# Patient Record
Sex: Female | Born: 1935
Health system: Southern US, Community
[De-identification: ages and names within clinical notes are randomized; demographics above are authoritative.]

## PROBLEM LIST (undated history)

## (undated) DIAGNOSIS — M1990 Unspecified osteoarthritis, unspecified site: Secondary | ICD-10-CM

## (undated) DIAGNOSIS — M8580 Other specified disorders of bone density and structure, unspecified site: Secondary | ICD-10-CM

## (undated) DIAGNOSIS — E039 Hypothyroidism, unspecified: Secondary | ICD-10-CM

## (undated) HISTORY — PX: TONSILLECTOMY: SUR1361

## (undated) HISTORY — PX: ABDOMINAL HYSTERECTOMY: SHX81

## (undated) HISTORY — PX: REPLACEMENT TOTAL KNEE: SUR1224

## (undated) HISTORY — PX: OTHER SURGICAL HISTORY: SHX169

---

## 1998-08-24 ENCOUNTER — Other Ambulatory Visit: Admission: RE | Admit: 1998-08-24 | Discharge: 1998-08-24 | Payer: Self-pay | Admitting: *Deleted

## 2000-05-23 ENCOUNTER — Encounter (INDEPENDENT_AMBULATORY_CARE_PROVIDER_SITE_OTHER): Payer: Self-pay

## 2000-05-23 ENCOUNTER — Other Ambulatory Visit: Admission: RE | Admit: 2000-05-23 | Discharge: 2000-05-23 | Payer: Self-pay | Admitting: *Deleted

## 2001-04-23 ENCOUNTER — Encounter: Admission: RE | Admit: 2001-04-23 | Discharge: 2001-04-23 | Payer: Self-pay | Admitting: Family Medicine

## 2001-04-23 ENCOUNTER — Encounter: Payer: Self-pay | Admitting: Family Medicine

## 2001-11-05 ENCOUNTER — Other Ambulatory Visit: Admission: RE | Admit: 2001-11-05 | Discharge: 2001-11-05 | Payer: Self-pay | Admitting: Obstetrics and Gynecology

## 2004-05-02 ENCOUNTER — Ambulatory Visit (HOSPITAL_COMMUNITY): Admission: RE | Admit: 2004-05-02 | Discharge: 2004-05-02 | Payer: Self-pay | Admitting: Gastroenterology

## 2004-08-21 ENCOUNTER — Emergency Department (HOSPITAL_COMMUNITY): Admission: EM | Admit: 2004-08-21 | Discharge: 2004-08-21 | Payer: Self-pay | Admitting: Emergency Medicine

## 2006-01-02 ENCOUNTER — Encounter: Admission: RE | Admit: 2006-01-02 | Discharge: 2006-01-02 | Payer: Self-pay | Admitting: Family Medicine

## 2007-06-06 ENCOUNTER — Encounter: Admission: RE | Admit: 2007-06-06 | Discharge: 2007-06-06 | Payer: Self-pay | Admitting: Orthopedic Surgery

## 2007-06-10 ENCOUNTER — Ambulatory Visit (HOSPITAL_BASED_OUTPATIENT_CLINIC_OR_DEPARTMENT_OTHER): Admission: RE | Admit: 2007-06-10 | Discharge: 2007-06-10 | Payer: Self-pay | Admitting: Orthopedic Surgery

## 2008-04-06 ENCOUNTER — Inpatient Hospital Stay (HOSPITAL_COMMUNITY): Admission: RE | Admit: 2008-04-06 | Discharge: 2008-04-10 | Payer: Self-pay | Admitting: Orthopaedic Surgery

## 2009-09-23 ENCOUNTER — Ambulatory Visit (HOSPITAL_COMMUNITY): Admission: RE | Admit: 2009-09-23 | Discharge: 2009-09-23 | Payer: Self-pay | Admitting: Gastroenterology

## 2010-11-07 NOTE — Op Note (Signed)
NAME:  Sarah Fletcher, Sarah Fletcher NO.:  [id number]   MEDICAL RECORD NO.:  [id number]          PATIENT TYPE:  AMB   LOCATION:  DSC                          FACILITY:  [facility]   PHYSICIAN:  Leonides Grills, M.D.     DATE OF BIRTH:  09/03/1929   DATE OF PROCEDURE:  06/10/2007  DATE OF DISCHARGE:                               OPERATIVE REPORT   PREOPERATIVE DIAGNOSIS:  Left second, third and fourth hammertoes.   POSTOPERATIVE DIAGNOSIS:  Left second, third and fourth hammertoes.   OPERATION:  1. Left second, third and fourth toes MTP joint dorsal capsulotomy      with collateral release.  2. Left second, third and fourth toes proximal phalanx head      resections.  3. Left second, third and fourth toes FDL to proximal phalanx tendon      transfers.  4. Left second, third and fourth toes EDB to EDL tendon transfers.   ANESTHESIA:  General.   SURGEON:  Leonides Grills, M.D.   ASSISTANT:  Evlyn Kanner, PA-C.   ESTIMATED BLOOD LOSS:  Minimal.   TOURNIQUET TIME:  Approximately an hour.   COMPLICATIONS:  None.   DISPOSITION:  Stable to PR.   INDICATIONS:  This is a 77 year old female who has had longstanding  hammertoe pain in her left forefoot that is interfering with her life to  the point where she cannot do what she wants to do despite wearing extra-  depth shoes and several types of pads.  She was consented for the above  procedure.  All risks which include infection, nerve or vessel injury,  persistent pain, worse pain, prolonged recovery, stiffness, that her  toes were moved differently than previously and a possibility of  recurrence of deformity and cock-up toe deformity were all explained.  Questions were encouraged and answered.   OPERATION:  The patient brought to the operating room and placed in the  supine position after adequate general endotracheal tube anesthesia was  administered as well as Ancef 1 gram IV piggyback.  The left lower  extremity was then  prepped and draped in sterile manner over a  proximally placed thigh tourniquet.  The limb was gravity exsanguinated.  Tourniquet was elevated to 290 mmHg.  A longitudinal incision over the  dorsal aspect of the left second toe was then made.  Dissection was  carried down through skin.  Hemostasis was obtained.  EDL and EDB  tendons were identified.  ADL tendon was tenotomized proximal medial and  the brevis distal lateral retracted out of harm's way.  An MTP joint  dorsal capsulotomy with collateral release was then performed with a 15  blade scalpel protecting the soft tissues both medial and laterally.  The distal aspect of the proximal phalanx was then skeletonized with a  15 blade, again protecting the soft tissues medial and laterally and the  head was then removed with the rongeur, followed by a bone cutter.  This  cut was made perpendicular to long axis and proximal phalanx.  We then  made a longitudinal scission to the plantar plate.  FDL tendon was  identified and tenotomized distal as possible.  We then created a drill  hole at the base of the proximal phalanx using a 2.5 followed by a 3.5  mm drill hole.  We then passed the FDL tendon through the drill hole  from plantar to dorsal using 3-0 PDS stitch.  This had an Interior and spatial designer.  We then placed a 0.045 K-wire antegrade through middle and  distal phalanx, reduced the PIP joint and fired this retrograde, holding  the toe in a reduced position with tension on the FDL tendon through the  drill hole with the ankle in loose  dorsiflexion.  We then fired the K-  wire across the MTP joint and this held the toe in excellent alignment.  We then transferred the EDB to EDL tendons using 3-0 PDS stitch and  sewed this to the stump of the FPL tendon as well, recreating the  extensor expansion.  The area was copiously irrigated with normal  saline.  We did the exact same procedure through separate incisions for  the third, fourth toes,  respectively.  Tourniquet was deflated.  Hemostasis was obtained.  Toes pinked up nicely.  Skin relieving  incisions were made on either side of the K-wires.  K-wires were bent,  cut and capped.  Subcu was closed with 3-0 Vicryl.  Skin was closed with  4-0 nylon.  Sterile dressing was applied.  Hard sole shoe was applied.  The patient was stable to the PR.      Leonides Grills, M.D.  Electronically Signed     PB/MEDQ  D:  06/10/2007  T:  06/10/2007  Job:  [id number]

## 2010-11-07 NOTE — Op Note (Signed)
NAME:  Sarah Fletcher, Sarah Fletcher NO.:  [id number]   MEDICAL RECORD NO.:  [id number]          PATIENT TYPE:  INP   LOCATION:  5011                         FACILITY:  [facility]   PHYSICIAN:  Lubertha Basque. Dalldorf, M.D.DATE OF BIRTH:  Jun 23, 1930   DATE OF PROCEDURE:  DATE OF DISCHARGE:                               OPERATIVE REPORT   PREOPERATIVE DIAGNOSIS:  Left knee degenerative arthritis.   POSTOPERATIVE DIAGNOSIS:  Left knee degenerative arthritis.   PROCEDURE:  Left total knee replacement.   ANESTHESIA:  General and block.   ATTENDING SURGEON:  Lubertha Basque. Jerl Santos, MD   ASSISTANT:  Lindwood Qua, PA   INDICATIONS FOR PROCEDURE:  The patient is a 77 year old woman with a  long history of degenerative left knee.  She has persisted with pain  despite conservative measures of various oral anti-inflammatories and  injectables.  By x-ray she has advanced degenerative change.  She is  offered a knee replacement operation with continued pain at rest and  pain was limits her ability to walk and exercise.  Informed operative  consent was obtained after discussion of possible complications  including reaction to anesthesia, infection, DVT, PE, and death.   SUMMARY OF FINDINGS AND PROCEDURE:  Under general anesthesia and a block  through a standard longitudinal anterior approach, a left knee  replacement was performed.  She had advanced degenerative change medial  and patellofemoral and good bone quality.  We addressed her problem with  a cemented DePuy LPS system using standard femur, 3 tibial tray, 10-mm  deep-dish insert, and 35-mm all-polyethylene patella.  I included  Zinacef antibiotic in the cement.  Lindwood Qua assisted throughout  and was invaluable to the completion of the case in that he helped  position and retract while I performed the procedure.  He also closed  simultaneously to help minimize OR time.   DESCRIPTION OF PROCEDURE:  The patient was taken to the  operating suite  where general anesthetic was applied without difficulty.  She was also  given a block in the preanesthesia area.  She was positioned supine and  prepped and draped in normal sterile fashion.  After administration of  IV Kefzol, the left leg was elevated, exsanguinated, and tourniquet was  inflated about the thigh.  A longitudinal anterior incision was made  with dissection down to the extensor mechanism.  All appropriate anti-  infected measures were used including closed hooded exhaust systems for  each member of surgical team, Betadine impregnated drape, and  preoperative IV antibiotic.  A medial parapatellar incision was made.  The kneecap was flipped and the knee flexed.  Some residual meniscal  tissues were removed along with the ACL and PCL.  An extramedullary  guide was placed in the tibia to make a cut with a slight posterior  tilt.  An intramedullary guide was then placed in the femur to make  anterior and posterior cuts creating a flexion gap of 10 mm.  A second  intramedullary guide was placed in the femur to make a distal cut  creating equal extension gap of 10 mm  balancing the knee.  The femur  sized to a standard and the tibia to a 3 with appropriate guides were  placed and utilized.  The patella was cut down thickness by 8 mm to a 15  and sized to 35 with appropriate guide placed and utilized.  The trial  reduction was done of these components and she easily came to slight  hyperextension and flexed well.  The kneecap tracked well.  Trial  components were removed followed by pulsatile lavage irrigation of all 3  cut bony surfaces.  Cement was mixed including Zinacef and pressurized  onto the bones.  The aforementioned DePuy LPS components were placed.  Excess cement was trimmed and pressure was held in component until  cement had hardened.  The tourniquet was deflated and a small amount of  bleeding was easily controlled with Bovie cautery.  During the case,  she  did have some gelatinous material from a meniscal cyst and from a Baker  cyst, which we evacuated.  The knee was thoroughly irrigated followed by  placement of a drain exiting superolaterally.  The extensor mechanism  was reapproximated with #1 Vicryl interrupted fashion followed by  subcutaneous reapproximation with 0 and 2-0 undyed Vicryl and skin  closure with staples.  Adaptic was applied followed by dry gauze and  loose Ace wrap.  Estimated blood loss and intraoperative fluids obtained  from anesthesia records as can accurate tourniquet time.   DISPOSITION:  The patient was extubated in the operating room and taken  to the recovery in stable condition.  She was to be admitted to the  Orthopedic Surgery Service for appropriate postop care to include  perioperative antibiotics and Coumadin plus Lovenox for DVT prophylaxis.      Lubertha Basque Jerl Santos, M.D.  Electronically Signed     PGD/MEDQ  D:  04/06/2008  T:  04/06/2008  Job:  [id number]

## 2010-11-10 NOTE — Discharge Summary (Signed)
NAME:  Sarah Fletcher, Sarah Fletcher NO.:  [id number]   MEDICAL RECORD NO.:  [id number]          PATIENT TYPE:  INP   LOCATION:  5011                         FACILITY:  [facility]   PHYSICIAN:  Lubertha Basque. Dalldorf, M.D.DATE OF BIRTH:  1930/01/27   DATE OF ADMISSION:  04/06/2008  DATE OF DISCHARGE:  04/10/2008                               DISCHARGE SUMMARY   ADMITTING DIAGNOSES:  1. Left knee end-stage degenerative joint disease.  2. Hypothyroidism.   DISCHARGE DIAGNOSES:  1. Left knee end-stage degenerative joint disease.  2. Hypothyroidism.   OPERATION:  Left total knee replacement.   BRIEF HISTORY:  [name] is a patient well known to our practice.  She  is a 77 year old white female who has had increasing left knee pain  despite oral anti-inflammatory medicines and corticosteroid injections.  Her x-rays reveal end-stage DJD.  We discussed treatment options with  her that being total knee prosthesis with the risks of anesthesia,  infection, DVT, and possible death.   PERTINENT LABORATORY DATA AND X-RAY FINDINGS:  Hemoglobin 10, hematocrit  29.8, WBC 7.7, and platelets 235.  Sodium 136, potassium 3.8, glucose  102, BUN 9, and creatinine 0.68.  Serial INRs were drawn.  On last  testing, INR 2.5   COURSE IN THE HOSPITAL:  She was admitted postoperatively, placed on  variety of p.o. and IM analgesics for pain, and a PCA pump was used for  pain control as well, and then she was placed on IV Ancef 1 g q.8 h. x3  doses, appropriate oral pain medicines, antiemetics, low-dose Coumadin  protocol and Lovenox protocol for DVT prophylaxis, incentive spirometer,  knee high TEDs, CPM machine 0-50.  Advance as tolerated, and then  Physical Therapy to be weightbearing as tolerated.  The first day  postop, her vital signs were blood pressure 105/68, temperature 97, and  heart rate 70.  Lungs were clear.  Abdomen was soft.  Drain was in her  knee and active.  Foley catheter also active.  PCA  pump was controlling.  Minimal nausea and vomiting.  Second day postop, her vital signs  remained stable.  Her lungs were clear.  Foley catheter was  discontinued.  Her dressing was changed and her drain was pulled from  her knee and her wound was noted to be benign.  No sign of infection or  irritation, still with some trouble with nausea, but was working well  with physical therapy.  She was later discharged from the hospital.   CONDITION ON DISCHARGE:  Improved.   FOLLOWUP:  She will remain on a low-sodium heart-healthy diet.  Change  her dressing daily.  Return to the office in 10 days calling [phone]  for appointment time.  Any sign of infection.  Also to call the same  number if there  were to be increasing redness, drainage, or increasing pain.  She will  be kept on Vicodin 1 or 2 q.4-6 h.  Coumadin dose regulated by pharmacy,  iron pill one a day, her Synthroid dose which is 75 mcg one daily, and  then laxative of  choice.       Lindwood Qua, P.A.      Lubertha Basque Jerl Santos, M.D.  Electronically Signed    MC/MEDQ  D:  04/11/2008  T:  04/12/2008  Job:  [id number]

## 2010-11-10 NOTE — Op Note (Signed)
NAME:  Sarah Fletcher, Sarah Fletcher NO.:  [id number]   MEDICAL RECORD NO.:  [id number]          PATIENT TYPE:  AMB   LOCATION:  ENDO                         FACILITY:  [facility]   PHYSICIAN:  Petra Kuba, M.D.    DATE OF BIRTH:  1929-10-29   DATE OF PROCEDURE:  05/02/2004  DATE OF DISCHARGE:                                 OPERATIVE REPORT   PROCEDURE:  Colonoscopy.   INDICATIONS:  Screening.   Consent was signed after risks, benefits, methods, options thoroughly  discussed in the office.   MEDICINES USED:  Fentanyl 100 mcg, Versed 6 mg.   PROCEDURE:   PROCEDURE:  Colonoscopy.   INDICATIONS:  Anemia.  Patient due for colonic screening.   Consent was signed after risks, benefits, methods, and options thoroughly  discussed in the office.   MEDICINES USED:  Demerol 60, Versed 7.   PROCEDURE:  Rectal inspection is pertinent for external hemorrhoids, small.  Digital exam was negative.  The video pediatric adjustable colonoscope was  inserted.  There was some difficulty due to a tortuous sigmoid, and once we  were through this area, we were much easier able to advance around the colon  to the cecum.  This did require some abdominal pressure and turning her on  her back.  Other than some scattered left-sided diverticula, no other  abnormalities were seen.  On insertion, the cecum was then identified by the  appendiceal orifice and the ileocecal valve.  Prep on the left was better  than on the right.  There was minimal liquid stool adherent to the wall,  which could not be washed off on the right.  The rest could be washed and  suctioned.  On slow withdrawal through the colon, other than the scattered  left-sided diverticula, no abnormalities were seen, specifically no polyps,  tumors, or masses.  Once back in the rectum, anorectal pull-through and  retroflexion confirmed some small hemorrhoids.  The scope was straightened  and readvanced shortways up the left side of the  colon.  Air was suctioned.  The scope removed.  The patient tolerated the procedure adequately.  There  was no obvious immediate complication.   ENDOSCOPIC DIAGNOSES:  1.  Internal/external hemorrhoids.  2.  Tortuous sigmoid.  3.  Scattered left-sided diverticula.  4.  Otherwise within normal limits to the cecum.   PLAN:  Happy to see back p.r.n., otherwise return to the care of Dr. Laurena Bering  and Dr. Manus Gunning for the customary health-care maintenance to include yearly  rectals and guaiacs and consider repeat screening in 5-10 years.  Might even  consider a virtual colonoscopy, based on her tortuosity, if widely  acceptable and available at that juncture.      MEM/MEDQ  D:  05/02/2004  T:  05/02/2004  Job:  [id number]   cc:   Bryan Lemma. Manus Gunning, M.D.  [address] [place]  [place] [address]  Fax: [phone]   Andres Ege, M.D.  [address]  [place]  [place] [address]  Fax: [phone]

## 2011-03-26 LAB — BASIC METABOLIC PANEL
BUN: 10
BUN: 5 — ABNORMAL LOW
BUN: 9
CO2: 28
CO2: 28
CO2: 29
Calcium: 8.2 — ABNORMAL LOW
Calcium: 8.3 — ABNORMAL LOW
Calcium: 8.3 — ABNORMAL LOW
Chloride: 101
Chloride: 102
Chloride: 102
Creatinine, Ser: 0.68
Creatinine, Ser: 0.68
Creatinine, Ser: 0.72
GFR calc Af Amer: >60
GFR calc Af Amer: >60
GFR calc Af Amer: >60
GFR calc non Af Amer: >60
GFR calc non Af Amer: >60
GFR calc non Af Amer: >60
Glucose, Bld: 102 — ABNORMAL HIGH
Glucose, Bld: 104 — ABNORMAL HIGH
Glucose, Bld: 93
Potassium: 3.8
Potassium: 4.3
Potassium: 4.5
Sodium: 135
Sodium: 136
Sodium: 136

## 2011-03-26 LAB — CBC
HCT: 29.8 — ABNORMAL LOW
HCT: 32.1 — ABNORMAL LOW
HCT: 35.3 — ABNORMAL LOW
HCT: 43.9
Hemoglobin: 10 — ABNORMAL LOW
Hemoglobin: 10.8 — ABNORMAL LOW
Hemoglobin: 12
Hemoglobin: 14.5
MCHC: 33
MCHC: 33.5
MCHC: 33.7
MCHC: 34
MCV: 93.6
MCV: 94.5
MCV: 94.8
MCV: 94.8
Platelets: 235
Platelets: 248
Platelets: 271
Platelets: 320
RBC: 3.15 — ABNORMAL LOW
RBC: 3.39 — ABNORMAL LOW
RBC: 3.78 — ABNORMAL LOW
RBC: 4.63
RDW: 13
RDW: 13.4
RDW: 13.6
RDW: 13.8
WBC: 7.7
WBC: 7.9
WBC: 9
WBC: 9.3

## 2011-03-26 LAB — PROTIME-INR
INR: 1.1
INR: 1.9 — ABNORMAL HIGH
INR: 1.9 — ABNORMAL HIGH
INR: 2.5 — ABNORMAL HIGH
Prothrombin Time: 14.3
Prothrombin Time: 23 — ABNORMAL HIGH
Prothrombin Time: 23.3 — ABNORMAL HIGH
Prothrombin Time: 28.6 — ABNORMAL HIGH

## 2011-03-30 LAB — POCT HEMOGLOBIN-HEMACUE
Hemoglobin: 15.3 — ABNORMAL HIGH
Operator id: 123881

## 2011-06-28 DIAGNOSIS — M25559 Pain in unspecified hip: Secondary | ICD-10-CM | POA: Diagnosis not present

## 2011-06-28 DIAGNOSIS — M545 Low back pain: Secondary | ICD-10-CM | POA: Diagnosis not present

## 2011-07-03 DIAGNOSIS — M545 Low back pain: Secondary | ICD-10-CM | POA: Diagnosis not present

## 2011-07-03 DIAGNOSIS — M25559 Pain in unspecified hip: Secondary | ICD-10-CM | POA: Diagnosis not present

## 2011-07-04 DIAGNOSIS — M25559 Pain in unspecified hip: Secondary | ICD-10-CM | POA: Diagnosis not present

## 2011-07-11 DIAGNOSIS — M545 Low back pain: Secondary | ICD-10-CM | POA: Diagnosis not present

## 2011-07-11 DIAGNOSIS — M25559 Pain in unspecified hip: Secondary | ICD-10-CM | POA: Diagnosis not present

## 2011-10-01 DIAGNOSIS — E039 Hypothyroidism, unspecified: Secondary | ICD-10-CM | POA: Diagnosis not present

## 2011-10-01 DIAGNOSIS — Z1331 Encounter for screening for depression: Secondary | ICD-10-CM | POA: Diagnosis not present

## 2011-10-01 DIAGNOSIS — Z23 Encounter for immunization: Secondary | ICD-10-CM | POA: Diagnosis not present

## 2011-10-01 DIAGNOSIS — N951 Menopausal and female climacteric states: Secondary | ICD-10-CM | POA: Diagnosis not present

## 2011-10-01 DIAGNOSIS — Z131 Encounter for screening for diabetes mellitus: Secondary | ICD-10-CM | POA: Diagnosis not present

## 2011-10-01 DIAGNOSIS — M899 Disorder of bone, unspecified: Secondary | ICD-10-CM | POA: Diagnosis not present

## 2011-10-01 DIAGNOSIS — Z0000 Encounter for general adult medical examination without abnormal findings: Secondary | ICD-10-CM | POA: Diagnosis not present

## 2011-10-01 DIAGNOSIS — E7800 Pure hypercholesterolemia, unspecified: Secondary | ICD-10-CM | POA: Diagnosis not present

## 2011-10-08 ENCOUNTER — Other Ambulatory Visit: Payer: Self-pay | Admitting: Orthopaedic Surgery

## 2011-10-08 DIAGNOSIS — M25551 Pain in right hip: Secondary | ICD-10-CM

## 2011-10-08 DIAGNOSIS — M25559 Pain in unspecified hip: Secondary | ICD-10-CM | POA: Diagnosis not present

## 2011-10-11 DIAGNOSIS — M76899 Other specified enthesopathies of unspecified lower limb, excluding foot: Secondary | ICD-10-CM | POA: Diagnosis not present

## 2011-10-11 DIAGNOSIS — M6789 Other specified disorders of synovium and tendon, multiple sites: Secondary | ICD-10-CM | POA: Diagnosis not present

## 2011-10-12 ENCOUNTER — Other Ambulatory Visit: Payer: Self-pay

## 2011-10-15 DIAGNOSIS — M25559 Pain in unspecified hip: Secondary | ICD-10-CM | POA: Diagnosis not present

## 2011-10-29 DIAGNOSIS — M25559 Pain in unspecified hip: Secondary | ICD-10-CM | POA: Diagnosis not present

## 2011-10-31 DIAGNOSIS — M25559 Pain in unspecified hip: Secondary | ICD-10-CM | POA: Diagnosis not present

## 2011-11-02 DIAGNOSIS — M545 Low back pain: Secondary | ICD-10-CM | POA: Diagnosis not present

## 2011-11-07 DIAGNOSIS — M25559 Pain in unspecified hip: Secondary | ICD-10-CM | POA: Diagnosis not present

## 2011-11-07 DIAGNOSIS — M545 Low back pain: Secondary | ICD-10-CM | POA: Diagnosis not present

## 2011-11-09 DIAGNOSIS — M25559 Pain in unspecified hip: Secondary | ICD-10-CM | POA: Diagnosis not present

## 2011-11-09 DIAGNOSIS — M545 Low back pain: Secondary | ICD-10-CM | POA: Diagnosis not present

## 2011-11-12 DIAGNOSIS — M25559 Pain in unspecified hip: Secondary | ICD-10-CM | POA: Diagnosis not present

## 2011-11-12 DIAGNOSIS — M545 Low back pain: Secondary | ICD-10-CM | POA: Diagnosis not present

## 2011-11-14 DIAGNOSIS — M25559 Pain in unspecified hip: Secondary | ICD-10-CM | POA: Diagnosis not present

## 2012-01-15 DIAGNOSIS — R05 Cough: Secondary | ICD-10-CM | POA: Diagnosis not present

## 2012-03-28 DIAGNOSIS — Z23 Encounter for immunization: Secondary | ICD-10-CM | POA: Diagnosis not present

## 2012-04-15 DIAGNOSIS — Z1231 Encounter for screening mammogram for malignant neoplasm of breast: Secondary | ICD-10-CM | POA: Diagnosis not present

## 2012-05-07 DIAGNOSIS — M899 Disorder of bone, unspecified: Secondary | ICD-10-CM | POA: Diagnosis not present

## 2012-05-07 DIAGNOSIS — M949 Disorder of cartilage, unspecified: Secondary | ICD-10-CM | POA: Diagnosis not present

## 2012-07-17 DIAGNOSIS — M949 Disorder of cartilage, unspecified: Secondary | ICD-10-CM | POA: Diagnosis not present

## 2012-07-17 DIAGNOSIS — M899 Disorder of bone, unspecified: Secondary | ICD-10-CM | POA: Diagnosis not present

## 2012-09-16 DIAGNOSIS — N952 Postmenopausal atrophic vaginitis: Secondary | ICD-10-CM | POA: Diagnosis not present

## 2012-09-16 DIAGNOSIS — D239 Other benign neoplasm of skin, unspecified: Secondary | ICD-10-CM | POA: Diagnosis not present

## 2012-09-16 DIAGNOSIS — E7800 Pure hypercholesterolemia, unspecified: Secondary | ICD-10-CM | POA: Diagnosis not present

## 2012-09-16 DIAGNOSIS — E039 Hypothyroidism, unspecified: Secondary | ICD-10-CM | POA: Diagnosis not present

## 2012-09-16 DIAGNOSIS — M949 Disorder of cartilage, unspecified: Secondary | ICD-10-CM | POA: Diagnosis not present

## 2012-09-16 DIAGNOSIS — M899 Disorder of bone, unspecified: Secondary | ICD-10-CM | POA: Diagnosis not present

## 2012-09-25 DIAGNOSIS — E875 Hyperkalemia: Secondary | ICD-10-CM | POA: Diagnosis not present

## 2012-10-15 DIAGNOSIS — D485 Neoplasm of uncertain behavior of skin: Secondary | ICD-10-CM | POA: Diagnosis not present

## 2012-10-15 DIAGNOSIS — L570 Actinic keratosis: Secondary | ICD-10-CM | POA: Diagnosis not present

## 2012-10-15 DIAGNOSIS — L821 Other seborrheic keratosis: Secondary | ICD-10-CM | POA: Diagnosis not present

## 2012-10-15 DIAGNOSIS — L919 Hypertrophic disorder of the skin, unspecified: Secondary | ICD-10-CM | POA: Diagnosis not present

## 2012-10-15 DIAGNOSIS — L909 Atrophic disorder of skin, unspecified: Secondary | ICD-10-CM | POA: Diagnosis not present

## 2012-10-15 DIAGNOSIS — L820 Inflamed seborrheic keratosis: Secondary | ICD-10-CM | POA: Diagnosis not present

## 2012-10-16 DIAGNOSIS — H6120 Impacted cerumen, unspecified ear: Secondary | ICD-10-CM | POA: Diagnosis not present

## 2012-10-16 DIAGNOSIS — Z1331 Encounter for screening for depression: Secondary | ICD-10-CM | POA: Diagnosis not present

## 2012-10-16 DIAGNOSIS — R252 Cramp and spasm: Secondary | ICD-10-CM | POA: Diagnosis not present

## 2012-10-16 DIAGNOSIS — R030 Elevated blood-pressure reading, without diagnosis of hypertension: Secondary | ICD-10-CM | POA: Diagnosis not present

## 2013-01-15 DIAGNOSIS — M899 Disorder of bone, unspecified: Secondary | ICD-10-CM | POA: Diagnosis not present

## 2013-01-15 DIAGNOSIS — M949 Disorder of cartilage, unspecified: Secondary | ICD-10-CM | POA: Diagnosis not present

## 2013-01-15 DIAGNOSIS — L821 Other seborrheic keratosis: Secondary | ICD-10-CM | POA: Diagnosis not present

## 2013-01-15 DIAGNOSIS — R030 Elevated blood-pressure reading, without diagnosis of hypertension: Secondary | ICD-10-CM | POA: Diagnosis not present

## 2013-01-15 DIAGNOSIS — L570 Actinic keratosis: Secondary | ICD-10-CM | POA: Diagnosis not present

## 2013-02-10 DIAGNOSIS — Z0289 Encounter for other administrative examinations: Secondary | ICD-10-CM | POA: Diagnosis not present

## 2013-02-10 DIAGNOSIS — E039 Hypothyroidism, unspecified: Secondary | ICD-10-CM | POA: Diagnosis not present

## 2013-02-10 DIAGNOSIS — M899 Disorder of bone, unspecified: Secondary | ICD-10-CM | POA: Diagnosis not present

## 2013-02-10 DIAGNOSIS — M1990 Unspecified osteoarthritis, unspecified site: Secondary | ICD-10-CM | POA: Diagnosis not present

## 2013-04-10 DIAGNOSIS — Z23 Encounter for immunization: Secondary | ICD-10-CM | POA: Diagnosis not present

## 2013-04-22 DIAGNOSIS — Z1231 Encounter for screening mammogram for malignant neoplasm of breast: Secondary | ICD-10-CM | POA: Diagnosis not present

## 2013-06-23 DIAGNOSIS — H25099 Other age-related incipient cataract, unspecified eye: Secondary | ICD-10-CM | POA: Diagnosis not present

## 2013-07-15 DIAGNOSIS — B9789 Other viral agents as the cause of diseases classified elsewhere: Secondary | ICD-10-CM | POA: Diagnosis not present

## 2013-07-21 DIAGNOSIS — M899 Disorder of bone, unspecified: Secondary | ICD-10-CM | POA: Diagnosis not present

## 2013-07-21 DIAGNOSIS — M949 Disorder of cartilage, unspecified: Secondary | ICD-10-CM | POA: Diagnosis not present

## 2013-07-23 DIAGNOSIS — H2510 Age-related nuclear cataract, unspecified eye: Secondary | ICD-10-CM | POA: Diagnosis not present

## 2013-07-23 DIAGNOSIS — H43819 Vitreous degeneration, unspecified eye: Secondary | ICD-10-CM | POA: Diagnosis not present

## 2013-07-23 DIAGNOSIS — H02839 Dermatochalasis of unspecified eye, unspecified eyelid: Secondary | ICD-10-CM | POA: Diagnosis not present

## 2013-07-23 DIAGNOSIS — H25019 Cortical age-related cataract, unspecified eye: Secondary | ICD-10-CM | POA: Diagnosis not present

## 2013-08-28 DIAGNOSIS — Z0000 Encounter for general adult medical examination without abnormal findings: Secondary | ICD-10-CM | POA: Diagnosis not present

## 2013-08-28 DIAGNOSIS — M1990 Unspecified osteoarthritis, unspecified site: Secondary | ICD-10-CM | POA: Diagnosis not present

## 2013-08-28 DIAGNOSIS — E7800 Pure hypercholesterolemia, unspecified: Secondary | ICD-10-CM | POA: Diagnosis not present

## 2013-08-28 DIAGNOSIS — H16139 Photokeratitis, unspecified eye: Secondary | ICD-10-CM | POA: Diagnosis not present

## 2013-08-28 DIAGNOSIS — Z131 Encounter for screening for diabetes mellitus: Secondary | ICD-10-CM | POA: Diagnosis not present

## 2013-08-28 DIAGNOSIS — Z23 Encounter for immunization: Secondary | ICD-10-CM | POA: Diagnosis not present

## 2013-08-28 DIAGNOSIS — M949 Disorder of cartilage, unspecified: Secondary | ICD-10-CM | POA: Diagnosis not present

## 2013-08-28 DIAGNOSIS — M899 Disorder of bone, unspecified: Secondary | ICD-10-CM | POA: Diagnosis not present

## 2013-08-28 DIAGNOSIS — E039 Hypothyroidism, unspecified: Secondary | ICD-10-CM | POA: Diagnosis not present

## 2013-08-28 DIAGNOSIS — L570 Actinic keratosis: Secondary | ICD-10-CM | POA: Diagnosis not present

## 2013-09-09 DIAGNOSIS — H2510 Age-related nuclear cataract, unspecified eye: Secondary | ICD-10-CM | POA: Diagnosis not present

## 2013-09-09 DIAGNOSIS — H2589 Other age-related cataract: Secondary | ICD-10-CM | POA: Diagnosis not present

## 2013-09-09 DIAGNOSIS — H25019 Cortical age-related cataract, unspecified eye: Secondary | ICD-10-CM | POA: Diagnosis not present

## 2013-10-07 DIAGNOSIS — H25019 Cortical age-related cataract, unspecified eye: Secondary | ICD-10-CM | POA: Diagnosis not present

## 2013-10-07 DIAGNOSIS — H2589 Other age-related cataract: Secondary | ICD-10-CM | POA: Diagnosis not present

## 2013-10-07 DIAGNOSIS — H2510 Age-related nuclear cataract, unspecified eye: Secondary | ICD-10-CM | POA: Diagnosis not present

## 2014-01-25 DIAGNOSIS — M810 Age-related osteoporosis without current pathological fracture: Secondary | ICD-10-CM | POA: Diagnosis not present

## 2014-04-09 DIAGNOSIS — Z23 Encounter for immunization: Secondary | ICD-10-CM | POA: Diagnosis not present

## 2014-07-12 DIAGNOSIS — M5134 Other intervertebral disc degeneration, thoracic region: Secondary | ICD-10-CM | POA: Diagnosis not present

## 2014-07-12 DIAGNOSIS — M5432 Sciatica, left side: Secondary | ICD-10-CM | POA: Diagnosis not present

## 2014-07-12 DIAGNOSIS — M9903 Segmental and somatic dysfunction of lumbar region: Secondary | ICD-10-CM | POA: Diagnosis not present

## 2014-07-12 DIAGNOSIS — M9901 Segmental and somatic dysfunction of cervical region: Secondary | ICD-10-CM | POA: Diagnosis not present

## 2014-07-12 DIAGNOSIS — M5135 Other intervertebral disc degeneration, thoracolumbar region: Secondary | ICD-10-CM | POA: Diagnosis not present

## 2014-07-12 DIAGNOSIS — M5030 Other cervical disc degeneration, unspecified cervical region: Secondary | ICD-10-CM | POA: Diagnosis not present

## 2014-07-12 DIAGNOSIS — M9902 Segmental and somatic dysfunction of thoracic region: Secondary | ICD-10-CM | POA: Diagnosis not present

## 2014-07-13 DIAGNOSIS — M9903 Segmental and somatic dysfunction of lumbar region: Secondary | ICD-10-CM | POA: Diagnosis not present

## 2014-07-13 DIAGNOSIS — M5432 Sciatica, left side: Secondary | ICD-10-CM | POA: Diagnosis not present

## 2014-07-13 DIAGNOSIS — M5134 Other intervertebral disc degeneration, thoracic region: Secondary | ICD-10-CM | POA: Diagnosis not present

## 2014-07-13 DIAGNOSIS — M5135 Other intervertebral disc degeneration, thoracolumbar region: Secondary | ICD-10-CM | POA: Diagnosis not present

## 2014-07-13 DIAGNOSIS — M5030 Other cervical disc degeneration, unspecified cervical region: Secondary | ICD-10-CM | POA: Diagnosis not present

## 2014-07-13 DIAGNOSIS — M9901 Segmental and somatic dysfunction of cervical region: Secondary | ICD-10-CM | POA: Diagnosis not present

## 2014-07-13 DIAGNOSIS — M9902 Segmental and somatic dysfunction of thoracic region: Secondary | ICD-10-CM | POA: Diagnosis not present

## 2014-07-14 DIAGNOSIS — M5432 Sciatica, left side: Secondary | ICD-10-CM | POA: Diagnosis not present

## 2014-07-14 DIAGNOSIS — M9901 Segmental and somatic dysfunction of cervical region: Secondary | ICD-10-CM | POA: Diagnosis not present

## 2014-07-14 DIAGNOSIS — M9902 Segmental and somatic dysfunction of thoracic region: Secondary | ICD-10-CM | POA: Diagnosis not present

## 2014-07-14 DIAGNOSIS — M5134 Other intervertebral disc degeneration, thoracic region: Secondary | ICD-10-CM | POA: Diagnosis not present

## 2014-07-14 DIAGNOSIS — M5030 Other cervical disc degeneration, unspecified cervical region: Secondary | ICD-10-CM | POA: Diagnosis not present

## 2014-07-14 DIAGNOSIS — M5135 Other intervertebral disc degeneration, thoracolumbar region: Secondary | ICD-10-CM | POA: Diagnosis not present

## 2014-07-14 DIAGNOSIS — M9903 Segmental and somatic dysfunction of lumbar region: Secondary | ICD-10-CM | POA: Diagnosis not present

## 2014-07-19 ENCOUNTER — Emergency Department (HOSPITAL_BASED_OUTPATIENT_CLINIC_OR_DEPARTMENT_OTHER): Payer: Medicare Other

## 2014-07-19 ENCOUNTER — Encounter (HOSPITAL_BASED_OUTPATIENT_CLINIC_OR_DEPARTMENT_OTHER): Payer: Self-pay | Admitting: Emergency Medicine

## 2014-07-19 ENCOUNTER — Emergency Department (HOSPITAL_BASED_OUTPATIENT_CLINIC_OR_DEPARTMENT_OTHER)
Admission: EM | Admit: 2014-07-19 | Discharge: 2014-07-19 | Disposition: A | Discharge status: Home / Self Care | Payer: Medicare Other | Attending: Emergency Medicine | Admitting: Emergency Medicine

## 2014-07-19 DIAGNOSIS — S33140A Subluxation of L4/L5 lumbar vertebra, initial encounter: Secondary | ICD-10-CM | POA: Diagnosis not present

## 2014-07-19 DIAGNOSIS — N390 Urinary tract infection, site not specified: Secondary | ICD-10-CM | POA: Insufficient documentation

## 2014-07-19 DIAGNOSIS — S32008A Other fracture of unspecified lumbar vertebra, initial encounter for closed fracture: Secondary | ICD-10-CM | POA: Diagnosis not present

## 2014-07-19 DIAGNOSIS — S32020A Wedge compression fracture of second lumbar vertebra, initial encounter for closed fracture: Secondary | ICD-10-CM | POA: Insufficient documentation

## 2014-07-19 DIAGNOSIS — Y9289 Other specified places as the place of occurrence of the external cause: Secondary | ICD-10-CM | POA: Insufficient documentation

## 2014-07-19 DIAGNOSIS — Y9389 Activity, other specified: Secondary | ICD-10-CM | POA: Diagnosis not present

## 2014-07-19 DIAGNOSIS — X58XXXA Exposure to other specified factors, initial encounter: Secondary | ICD-10-CM | POA: Insufficient documentation

## 2014-07-19 DIAGNOSIS — B9689 Other specified bacterial agents as the cause of diseases classified elsewhere: Secondary | ICD-10-CM | POA: Diagnosis not present

## 2014-07-19 DIAGNOSIS — R52 Pain, unspecified: Secondary | ICD-10-CM

## 2014-07-19 DIAGNOSIS — S3992XA Unspecified injury of lower back, initial encounter: Secondary | ICD-10-CM | POA: Diagnosis present

## 2014-07-19 DIAGNOSIS — Y998 Other external cause status: Secondary | ICD-10-CM | POA: Insufficient documentation

## 2014-07-19 DIAGNOSIS — S32000A Wedge compression fracture of unspecified lumbar vertebra, initial encounter for closed fracture: Secondary | ICD-10-CM

## 2014-07-19 HISTORY — DX: Unspecified osteoarthritis, unspecified site: M19.90

## 2014-07-19 HISTORY — DX: Hypothyroidism, unspecified: E03.9

## 2014-07-19 HISTORY — DX: Other specified disorders of bone density and structure, unspecified site: M85.80

## 2014-07-19 LAB — URINALYSIS, ROUTINE W REFLEX MICROSCOPIC
Bilirubin Urine: NEGATIVE
Glucose, UA: NEGATIVE mg/dL
KETONES UR: NEGATIVE mg/dL
Nitrite: NEGATIVE
PROTEIN: NEGATIVE mg/dL
SPECIFIC GRAVITY, URINE: 1.02 (ref 1.005–1.030)
UROBILINOGEN UA: 0.2 mg/dL (ref 0.0–1.0)
pH: 5 (ref 5.0–8.0)

## 2014-07-19 LAB — URINE MICROSCOPIC-ADD ON

## 2014-07-19 MED ORDER — PHENAZOPYRIDINE HCL 200 MG PO TABS: 200.0000 mg | ORAL_TABLET | Freq: Three times a day (TID) | ORAL | Status: DC

## 2014-07-19 MED ORDER — PHENAZOPYRIDINE HCL 100 MG PO TABS
200.0000 mg | ORAL_TABLET | Freq: Three times a day (TID) | ORAL | Status: DC
  Administered 2014-07-19: 200 mg via ORAL
  Filled 2014-07-19: qty 2

## 2014-07-19 MED ORDER — CEPHALEXIN 500 MG PO CAPS: 500.0000 mg | ORAL_CAPSULE | Freq: Four times a day (QID) | ORAL | Status: DC

## 2014-07-19 MED ORDER — CEPHALEXIN 250 MG PO CAPS
500.0000 mg | ORAL_CAPSULE | Freq: Once | ORAL | Status: AC
  Administered 2014-07-19: 500 mg via ORAL
  Filled 2014-07-19: qty 2

## 2014-07-19 NOTE — ED Notes (Signed)
Transported to xray 

## 2014-07-19 NOTE — ED Provider Notes (Signed)
CSN: [id number]     Arrival date & time 07/19/14  0430 History   First MD Initiated Contact with Patient 07/19/14 0441     Chief Complaint  Patient presents with  . Back Pain     (Consider location/radiation/quality/duration/timing/severity/associated sxs/prior Treatment) Patient is a 81 y.o. female presenting with back pain. The history is provided by the patient.  Back Pain Location:  Lumbar spine Quality:  Cramping Radiates to:  Does not radiate Pain severity:  Moderate Pain is:  Same all the time Onset quality:  Gradual Timing:  Intermittent Progression:  Unchanged Chronicity:  New Context: emotional stress and lifting heavy objects   Context comment:  Lifting husband Relieved by:  Nothing Worsened by:  Nothing tried Ineffective treatments:  None tried Associated symptoms: no abdominal pain, no abdominal swelling, no bladder incontinence, no bowel incontinence, no chest pain, no dysuria, no fever, no headaches, no leg pain, no numbness, no paresthesias, no pelvic pain, no perianal numbness, no tingling, no weakness and no weight loss   Associated symptoms comment:  Recently started having urinary frequency Risk factors: no hx of cancer   was lifting her husband during his illness   History reviewed. No pertinent past medical history. No past surgical history on file. History reviewed. No pertinent family history. History  Substance Use Topics  . Smoking status: Not on file  . Smokeless tobacco: Not on file  . Alcohol Use: Not on file   OB History    No data available     Review of Systems  Constitutional: Negative for fever and weight loss.  Cardiovascular: Negative for chest pain.  Gastrointestinal: Negative for abdominal pain and bowel incontinence.  Genitourinary: Positive for frequency. Negative for bladder incontinence, dysuria, flank pain, difficulty urinating and pelvic pain.  Musculoskeletal: Positive for back pain.  Neurological: Negative for tingling,  weakness, numbness, headaches and paresthesias.  All other systems reviewed and are negative.     Allergies  Review of patient's allergies indicates not on file.  Home Medications   Prior to Admission medications   Not on File   BP 185/101 mmHg  Pulse 97  Resp 20  SpO2 100% Physical Exam  Constitutional: She is oriented to person, place, and time. She appears well-developed and well-nourished. No distress.  HENT:  Head: Normocephalic and atraumatic.  Mouth/Throat: Oropharynx is clear and moist.  Eyes: Conjunctivae are normal. Pupils are equal, round, and reactive to light.  Neck: Normal range of motion. Neck supple.  Cardiovascular: Normal rate, regular rhythm and intact distal pulses.   Pulmonary/Chest: Effort normal and breath sounds normal. No respiratory distress. She has no wheezes. She has no rales.  Abdominal: Soft. Bowel sounds are normal. There is no tenderness. There is no rebound and no guarding.  Musculoskeletal: Normal range of motion.  Gait is steady and intact  Neurological: She is alert and oriented to person, place, and time. She has normal reflexes.  Skin: Skin is warm and dry.  Psychiatric: She has a normal mood and affect.    ED Course  Procedures (including critical care time) Labs Review Labs Reviewed  URINALYSIS, ROUTINE W REFLEX MICROSCOPIC    Imaging Review No results found.   EKG Interpretation None      MDM   Final diagnoses:  None    UTI 1. We will start keflex and pyridium for bladder spasm  Lumbar compression fracture 2. Patient has a compression fracture at L2.  She is taking Mobic at this time.  Will  rtefer patient to her PMD and Dr. Annette Stable of neurosurgery for ongoing management of this issue.     Carlisle Beers, MD 07/19/14 [phone]

## 2014-07-19 NOTE — ED Notes (Signed)
Returned from xray

## 2014-07-19 NOTE — ED Notes (Addendum)
C/o lower back pain with spasms that has been on and off since Dec. Has been taking care of her husband who recently passed away from Cancer. Denies any recent injury.  Has seen a chiropractor recently  for same.  States yesterday she also started having urinary frequency. Has been taking tylenol and meloxicam for pain  Without relief. Denies any fevers. Denies any radiation of pain. Urine cloudy on exam.

## 2014-07-20 ENCOUNTER — Emergency Department (HOSPITAL_BASED_OUTPATIENT_CLINIC_OR_DEPARTMENT_OTHER)
Admission: EM | Admit: 2014-07-20 | Discharge: 2014-07-20 | Disposition: A | Discharge status: Home / Self Care | Payer: Medicare Other | Attending: Emergency Medicine | Admitting: Emergency Medicine

## 2014-07-20 ENCOUNTER — Emergency Department (HOSPITAL_BASED_OUTPATIENT_CLINIC_OR_DEPARTMENT_OTHER): Payer: Medicare Other

## 2014-07-20 ENCOUNTER — Encounter (HOSPITAL_BASED_OUTPATIENT_CLINIC_OR_DEPARTMENT_OTHER): Payer: Self-pay

## 2014-07-20 DIAGNOSIS — Z792 Long term (current) use of antibiotics: Secondary | ICD-10-CM | POA: Insufficient documentation

## 2014-07-20 DIAGNOSIS — E871 Hypo-osmolality and hyponatremia: Secondary | ICD-10-CM | POA: Insufficient documentation

## 2014-07-20 DIAGNOSIS — Y9289 Other specified places as the place of occurrence of the external cause: Secondary | ICD-10-CM | POA: Diagnosis not present

## 2014-07-20 DIAGNOSIS — Y9389 Activity, other specified: Secondary | ICD-10-CM | POA: Diagnosis not present

## 2014-07-20 DIAGNOSIS — Y998 Other external cause status: Secondary | ICD-10-CM | POA: Insufficient documentation

## 2014-07-20 DIAGNOSIS — IMO0001 Reserved for inherently not codable concepts without codable children: Secondary | ICD-10-CM

## 2014-07-20 DIAGNOSIS — R52 Pain, unspecified: Secondary | ICD-10-CM

## 2014-07-20 DIAGNOSIS — T50905A Adverse effect of unspecified drugs, medicaments and biological substances, initial encounter: Secondary | ICD-10-CM

## 2014-07-20 DIAGNOSIS — T887XXA Unspecified adverse effect of drug or medicament, initial encounter: Secondary | ICD-10-CM | POA: Diagnosis not present

## 2014-07-20 DIAGNOSIS — R109 Unspecified abdominal pain: Secondary | ICD-10-CM | POA: Diagnosis not present

## 2014-07-20 DIAGNOSIS — F419 Anxiety disorder, unspecified: Secondary | ICD-10-CM | POA: Diagnosis not present

## 2014-07-20 DIAGNOSIS — Z8739 Personal history of other diseases of the musculoskeletal system and connective tissue: Secondary | ICD-10-CM | POA: Insufficient documentation

## 2014-07-20 DIAGNOSIS — Z8781 Personal history of (healed) traumatic fracture: Secondary | ICD-10-CM | POA: Diagnosis not present

## 2014-07-20 DIAGNOSIS — R350 Frequency of micturition: Secondary | ICD-10-CM | POA: Diagnosis not present

## 2014-07-20 DIAGNOSIS — E039 Hypothyroidism, unspecified: Secondary | ICD-10-CM | POA: Insufficient documentation

## 2014-07-20 DIAGNOSIS — M545 Low back pain: Secondary | ICD-10-CM | POA: Diagnosis not present

## 2014-07-20 DIAGNOSIS — R110 Nausea: Secondary | ICD-10-CM | POA: Diagnosis not present

## 2014-07-20 DIAGNOSIS — T404X5A Adverse effect of other synthetic narcotics, initial encounter: Secondary | ICD-10-CM | POA: Diagnosis not present

## 2014-07-20 DIAGNOSIS — R143 Flatulence: Secondary | ICD-10-CM | POA: Diagnosis not present

## 2014-07-20 DIAGNOSIS — Z79899 Other long term (current) drug therapy: Secondary | ICD-10-CM | POA: Insufficient documentation

## 2014-07-20 LAB — BASIC METABOLIC PANEL
ANION GAP: 6 (ref 5–15)
BUN: 16 mg/dL (ref 6–23)
CALCIUM: 8.6 mg/dL (ref 8.4–10.5)
CO2: 24 mmol/L (ref 19–32)
CREATININE: 0.65 mg/dL (ref 0.50–1.10)
Chloride: 98 mmol/L (ref 96–112)
GFR calc Af Amer: >90 mL/min (ref >90)
GFR calc non Af Amer: 80 mL/min — ABNORMAL LOW (ref >90)
GLUCOSE: 104 mg/dL — AB (ref 70–99)
Potassium: 4.2 mmol/L (ref 3.5–5.1)
Sodium: 128 mmol/L — ABNORMAL LOW (ref 135–145)

## 2014-07-20 LAB — CBC WITH DIFFERENTIAL/PLATELET
BASOS ABS: 0 10*3/uL (ref 0.0–0.1)
Basophils Relative: 0 % (ref 0–1)
EOS PCT: 1 % (ref 0–5)
Eosinophils Absolute: 0.1 10*3/uL (ref 0.0–0.7)
HEMATOCRIT: 42.4 % (ref 36.0–46.0)
Hemoglobin: 14.3 g/dL (ref 12.0–15.0)
LYMPHS PCT: 11 % — AB (ref 12–46)
Lymphs Abs: 0.8 10*3/uL (ref 0.7–4.0)
MCH: 30.4 pg (ref 26.0–34.0)
MCHC: 33.7 g/dL (ref 30.0–36.0)
MCV: 90 fL (ref 78.0–100.0)
MONO ABS: 0.6 10*3/uL (ref 0.1–1.0)
Monocytes Relative: 8 % (ref 3–12)
Neutro Abs: 6 10*3/uL (ref 1.7–7.7)
Neutrophils Relative %: 80 % — ABNORMAL HIGH (ref 43–77)
Platelets: 365 10*3/uL (ref 150–400)
RBC: 4.71 MIL/uL (ref 3.87–5.11)
RDW: 12.5 % (ref 11.5–15.5)
WBC: 7.6 10*3/uL (ref 4.0–10.5)

## 2014-07-20 LAB — URINALYSIS, ROUTINE W REFLEX MICROSCOPIC
GLUCOSE, UA: NEGATIVE mg/dL
HGB URINE DIPSTICK: NEGATIVE
KETONES UR: 40 mg/dL — AB
Nitrite: POSITIVE — AB
PROTEIN: NEGATIVE mg/dL
Specific Gravity, Urine: 1.02 (ref 1.005–1.030)
Urobilinogen, UA: 1 mg/dL (ref 0.0–1.0)
pH: 7 (ref 5.0–8.0)

## 2014-07-20 LAB — URINE MICROSCOPIC-ADD ON

## 2014-07-20 MED ORDER — MELOXICAM 7.5 MG PO TABS: 7.5000 mg | ORAL_TABLET | Freq: Every day | ORAL | Status: DC

## 2014-07-20 MED ORDER — CULTURELLE PO CAPS: 1.0000 | ORAL_CAPSULE | Freq: Two times a day (BID) | ORAL | Status: DC

## 2014-07-20 MED ORDER — ONDANSETRON 8 MG PO TBDP
8.0000 mg | ORAL_TABLET | Freq: Once | ORAL | Status: AC
  Administered 2014-07-20: 8 mg via ORAL
  Filled 2014-07-20: qty 1

## 2014-07-20 MED ORDER — GI COCKTAIL ~~LOC~~
30.0000 mL | Freq: Once | ORAL | Status: AC
  Administered 2014-07-20: 30 mL via ORAL
  Filled 2014-07-20: qty 30

## 2014-07-20 NOTE — ED Notes (Signed)
Pt c/o nausea after taken her antibiotics tonight, states took zofran 1hr ago with no relief
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
## 2014-07-21 LAB — URINE CULTURE
CULTURE: NO GROWTH
Colony Count: NO GROWTH
SPECIAL REQUESTS: NORMAL

## 2014-07-22 DIAGNOSIS — S32020A Wedge compression fracture of second lumbar vertebra, initial encounter for closed fracture: Secondary | ICD-10-CM | POA: Diagnosis not present

## 2014-07-29 DIAGNOSIS — M810 Age-related osteoporosis without current pathological fracture: Secondary | ICD-10-CM | POA: Diagnosis not present

## 2014-07-29 DIAGNOSIS — E871 Hypo-osmolality and hyponatremia: Secondary | ICD-10-CM | POA: Diagnosis not present

## 2014-08-10 DIAGNOSIS — H01001 Unspecified blepharitis right upper eyelid: Secondary | ICD-10-CM | POA: Diagnosis not present

## 2014-08-10 DIAGNOSIS — Z961 Presence of intraocular lens: Secondary | ICD-10-CM | POA: Diagnosis not present

## 2014-08-10 DIAGNOSIS — H43813 Vitreous degeneration, bilateral: Secondary | ICD-10-CM | POA: Diagnosis not present

## 2014-08-10 DIAGNOSIS — H52203 Unspecified astigmatism, bilateral: Secondary | ICD-10-CM | POA: Diagnosis not present

## 2014-08-26 DIAGNOSIS — S32020A Wedge compression fracture of second lumbar vertebra, initial encounter for closed fracture: Secondary | ICD-10-CM | POA: Diagnosis not present

## 2014-09-06 DIAGNOSIS — E039 Hypothyroidism, unspecified: Secondary | ICD-10-CM | POA: Diagnosis not present

## 2014-09-06 DIAGNOSIS — N951 Menopausal and female climacteric states: Secondary | ICD-10-CM | POA: Diagnosis not present

## 2014-09-06 DIAGNOSIS — E780 Pure hypercholesterolemia: Secondary | ICD-10-CM | POA: Diagnosis not present

## 2014-09-06 DIAGNOSIS — M8000XA Age-related osteoporosis with current pathological fracture, unspecified site, initial encounter for fracture: Secondary | ICD-10-CM | POA: Diagnosis not present

## 2014-09-06 DIAGNOSIS — Z0000 Encounter for general adult medical examination without abnormal findings: Secondary | ICD-10-CM | POA: Diagnosis not present

## 2014-09-06 DIAGNOSIS — Z1389 Encounter for screening for other disorder: Secondary | ICD-10-CM | POA: Diagnosis not present

## 2014-09-06 DIAGNOSIS — M1990 Unspecified osteoarthritis, unspecified site: Secondary | ICD-10-CM | POA: Diagnosis not present

## 2014-09-10 DIAGNOSIS — Z1231 Encounter for screening mammogram for malignant neoplasm of breast: Secondary | ICD-10-CM | POA: Diagnosis not present

## 2014-09-21 DIAGNOSIS — M899 Disorder of bone, unspecified: Secondary | ICD-10-CM | POA: Diagnosis not present

## 2014-09-21 DIAGNOSIS — M8000XA Age-related osteoporosis with current pathological fracture, unspecified site, initial encounter for fracture: Secondary | ICD-10-CM | POA: Diagnosis not present

## 2014-09-21 DIAGNOSIS — M8580 Other specified disorders of bone density and structure, unspecified site: Secondary | ICD-10-CM | POA: Diagnosis not present

## 2014-10-28 DIAGNOSIS — H4312 Vitreous hemorrhage, left eye: Secondary | ICD-10-CM | POA: Diagnosis not present

## 2014-10-28 DIAGNOSIS — H43812 Vitreous degeneration, left eye: Secondary | ICD-10-CM | POA: Diagnosis not present

## 2014-11-02 DIAGNOSIS — H43392 Other vitreous opacities, left eye: Secondary | ICD-10-CM | POA: Diagnosis not present

## 2014-11-02 DIAGNOSIS — H43813 Vitreous degeneration, bilateral: Secondary | ICD-10-CM | POA: Diagnosis not present

## 2014-11-09 DIAGNOSIS — M545 Low back pain: Secondary | ICD-10-CM | POA: Diagnosis not present

## 2014-11-09 DIAGNOSIS — M79671 Pain in right foot: Secondary | ICD-10-CM | POA: Diagnosis not present

## 2014-11-18 DIAGNOSIS — M79671 Pain in right foot: Secondary | ICD-10-CM | POA: Diagnosis not present

## 2014-11-18 DIAGNOSIS — M545 Low back pain: Secondary | ICD-10-CM | POA: Diagnosis not present

## 2014-12-07 DIAGNOSIS — H43812 Vitreous degeneration, left eye: Secondary | ICD-10-CM | POA: Diagnosis not present

## 2014-12-08 DIAGNOSIS — M8000XA Age-related osteoporosis with current pathological fracture, unspecified site, initial encounter for fracture: Secondary | ICD-10-CM | POA: Diagnosis not present

## 2014-12-08 DIAGNOSIS — M545 Low back pain: Secondary | ICD-10-CM | POA: Diagnosis not present

## 2014-12-08 DIAGNOSIS — M79671 Pain in right foot: Secondary | ICD-10-CM | POA: Diagnosis not present

## 2014-12-08 DIAGNOSIS — F39 Unspecified mood [affective] disorder: Secondary | ICD-10-CM | POA: Diagnosis not present

## 2015-03-10 DIAGNOSIS — Z23 Encounter for immunization: Secondary | ICD-10-CM | POA: Diagnosis not present

## 2015-03-10 DIAGNOSIS — M8000XA Age-related osteoporosis with current pathological fracture, unspecified site, initial encounter for fracture: Secondary | ICD-10-CM | POA: Diagnosis not present

## 2015-03-10 DIAGNOSIS — E039 Hypothyroidism, unspecified: Secondary | ICD-10-CM | POA: Diagnosis not present

## 2015-03-10 DIAGNOSIS — R42 Dizziness and giddiness: Secondary | ICD-10-CM | POA: Diagnosis not present

## 2015-07-07 DIAGNOSIS — J209 Acute bronchitis, unspecified: Secondary | ICD-10-CM | POA: Diagnosis not present

## 2015-07-11 ENCOUNTER — Ambulatory Visit
Admission: RE | Admit: 2015-07-11 | Discharge: 2015-07-11 | Disposition: A | Discharge status: Home / Self Care | Payer: Medicare Other | Source: Ambulatory Visit | Attending: Family Medicine | Admitting: Family Medicine

## 2015-07-11 ENCOUNTER — Other Ambulatory Visit: Payer: Self-pay | Admitting: Family Medicine

## 2015-07-11 DIAGNOSIS — R059 Cough, unspecified: Secondary | ICD-10-CM

## 2015-07-11 DIAGNOSIS — R05 Cough: Secondary | ICD-10-CM

## 2015-07-11 DIAGNOSIS — H6120 Impacted cerumen, unspecified ear: Secondary | ICD-10-CM | POA: Diagnosis not present

## 2015-07-11 DIAGNOSIS — J9801 Acute bronchospasm: Secondary | ICD-10-CM | POA: Diagnosis not present

## 2015-08-12 DIAGNOSIS — H04123 Dry eye syndrome of bilateral lacrimal glands: Secondary | ICD-10-CM | POA: Diagnosis not present

## 2015-08-12 DIAGNOSIS — H43813 Vitreous degeneration, bilateral: Secondary | ICD-10-CM | POA: Diagnosis not present

## 2015-08-12 DIAGNOSIS — H5213 Myopia, bilateral: Secondary | ICD-10-CM | POA: Diagnosis not present

## 2015-08-12 DIAGNOSIS — H01001 Unspecified blepharitis right upper eyelid: Secondary | ICD-10-CM | POA: Diagnosis not present

## 2015-09-07 DIAGNOSIS — Z1389 Encounter for screening for other disorder: Secondary | ICD-10-CM | POA: Diagnosis not present

## 2015-09-07 DIAGNOSIS — S90424A Blister (nonthermal), right lesser toe(s), initial encounter: Secondary | ICD-10-CM | POA: Diagnosis not present

## 2015-09-07 DIAGNOSIS — M1990 Unspecified osteoarthritis, unspecified site: Secondary | ICD-10-CM | POA: Diagnosis not present

## 2015-09-07 DIAGNOSIS — E7800 Pure hypercholesterolemia, unspecified: Secondary | ICD-10-CM | POA: Diagnosis not present

## 2015-09-07 DIAGNOSIS — M8000XA Age-related osteoporosis with current pathological fracture, unspecified site, initial encounter for fracture: Secondary | ICD-10-CM | POA: Diagnosis not present

## 2015-09-07 DIAGNOSIS — E559 Vitamin D deficiency, unspecified: Secondary | ICD-10-CM | POA: Diagnosis not present

## 2015-09-07 DIAGNOSIS — Z0000 Encounter for general adult medical examination without abnormal findings: Secondary | ICD-10-CM | POA: Diagnosis not present

## 2015-09-07 DIAGNOSIS — E039 Hypothyroidism, unspecified: Secondary | ICD-10-CM | POA: Diagnosis not present

## 2015-09-07 DIAGNOSIS — T148 Other injury of unspecified body region: Secondary | ICD-10-CM | POA: Diagnosis not present

## 2015-09-08 DIAGNOSIS — M1990 Unspecified osteoarthritis, unspecified site: Secondary | ICD-10-CM | POA: Diagnosis not present

## 2015-09-12 ENCOUNTER — Other Ambulatory Visit: Payer: Self-pay

## 2015-09-19 DIAGNOSIS — Z1231 Encounter for screening mammogram for malignant neoplasm of breast: Secondary | ICD-10-CM | POA: Diagnosis not present

## 2016-03-12 DIAGNOSIS — M8000XA Age-related osteoporosis with current pathological fracture, unspecified site, initial encounter for fracture: Secondary | ICD-10-CM | POA: Diagnosis not present

## 2016-03-12 DIAGNOSIS — Z23 Encounter for immunization: Secondary | ICD-10-CM | POA: Diagnosis not present

## 2016-04-03 ENCOUNTER — Emergency Department (HOSPITAL_BASED_OUTPATIENT_CLINIC_OR_DEPARTMENT_OTHER)
Admission: EM | Admit: 2016-04-03 | Discharge: 2016-04-03 | Disposition: A | Discharge status: Home / Self Care | Payer: Medicare Other | Attending: Emergency Medicine | Admitting: Emergency Medicine

## 2016-04-03 ENCOUNTER — Emergency Department (HOSPITAL_BASED_OUTPATIENT_CLINIC_OR_DEPARTMENT_OTHER): Payer: Medicare Other

## 2016-04-03 ENCOUNTER — Encounter (HOSPITAL_BASED_OUTPATIENT_CLINIC_OR_DEPARTMENT_OTHER): Payer: Self-pay | Admitting: Emergency Medicine

## 2016-04-03 DIAGNOSIS — W19XXXA Unspecified fall, initial encounter: Secondary | ICD-10-CM

## 2016-04-03 DIAGNOSIS — S0990XA Unspecified injury of head, initial encounter: Secondary | ICD-10-CM | POA: Diagnosis not present

## 2016-04-03 DIAGNOSIS — S0101XA Laceration without foreign body of scalp, initial encounter: Secondary | ICD-10-CM | POA: Diagnosis not present

## 2016-04-03 DIAGNOSIS — E039 Hypothyroidism, unspecified: Secondary | ICD-10-CM | POA: Insufficient documentation

## 2016-04-03 DIAGNOSIS — S22020A Wedge compression fracture of second thoracic vertebra, initial encounter for closed fracture: Secondary | ICD-10-CM

## 2016-04-03 DIAGNOSIS — S12001A Unspecified nondisplaced fracture of first cervical vertebra, initial encounter for closed fracture: Secondary | ICD-10-CM | POA: Insufficient documentation

## 2016-04-03 DIAGNOSIS — Z79899 Other long term (current) drug therapy: Secondary | ICD-10-CM | POA: Diagnosis not present

## 2016-04-03 DIAGNOSIS — Y92019 Unspecified place in single-family (private) house as the place of occurrence of the external cause: Secondary | ICD-10-CM | POA: Insufficient documentation

## 2016-04-03 DIAGNOSIS — W01198A Fall on same level from slipping, tripping and stumbling with subsequent striking against other object, initial encounter: Secondary | ICD-10-CM | POA: Insufficient documentation

## 2016-04-03 DIAGNOSIS — Y999 Unspecified external cause status: Secondary | ICD-10-CM | POA: Diagnosis not present

## 2016-04-03 DIAGNOSIS — Y9301 Activity, walking, marching and hiking: Secondary | ICD-10-CM | POA: Insufficient documentation

## 2016-04-03 DIAGNOSIS — S199XXA Unspecified injury of neck, initial encounter: Secondary | ICD-10-CM | POA: Diagnosis not present

## 2016-04-03 DIAGNOSIS — S12091A Other nondisplaced fracture of first cervical vertebra, initial encounter for closed fracture: Secondary | ICD-10-CM

## 2016-04-03 DIAGNOSIS — S12000A Unspecified displaced fracture of first cervical vertebra, initial encounter for closed fracture: Secondary | ICD-10-CM | POA: Diagnosis not present

## 2016-04-03 MED ORDER — OXYCODONE HCL 5 MG/5ML PO SOLN
1.0000 mg | Freq: Once | ORAL | Status: DC
  Filled 2016-04-03: qty 5

## 2016-04-03 MED ORDER — LIDOCAINE-EPINEPHRINE (PF) 2 %-1:200000 IJ SOLN: 20.0000 mL | Freq: Once | INTRAMUSCULAR | Status: DC

## 2016-04-03 MED ORDER — DOCUSATE SODIUM 100 MG PO CAPS: 100.0000 mg | ORAL_CAPSULE | Freq: Two times a day (BID) | ORAL | 0 refills | Status: AC

## 2016-04-03 MED ORDER — ONDANSETRON 4 MG PO TBDP
4.0000 mg | ORAL_TABLET | Freq: Once | ORAL | Status: AC
  Administered 2016-04-03: 4 mg via ORAL
  Filled 2016-04-03: qty 1

## 2016-04-03 MED ORDER — OXYCODONE HCL 5 MG/5ML PO SOLN: 2.5000 mg | Freq: Four times a day (QID) | ORAL | 0 refills | Status: DC | PRN

## 2016-04-03 MED ORDER — MUPIROCIN CALCIUM 2 % EX CREA: 1.0000 "application " | TOPICAL_CREAM | Freq: Two times a day (BID) | CUTANEOUS | 0 refills | Status: DC

## 2016-04-03 MED ORDER — HYDROCODONE-ACETAMINOPHEN 7.5-325 MG/15ML PO SOLN
5.0000 mL | Freq: Once | ORAL | Status: AC
  Administered 2016-04-03: 5 mL via ORAL
  Filled 2016-04-03: qty 15

## 2016-04-03 MED ORDER — LIDOCAINE-EPINEPHRINE 2 %-1:100000 IJ SOLN
INTRAMUSCULAR | Status: AC
  Administered 2016-04-03: 12:00:00
  Filled 2016-04-03: qty 1

## 2016-04-03 MED ORDER — CEPHALEXIN 500 MG PO CAPS: 500.0000 mg | ORAL_CAPSULE | Freq: Three times a day (TID) | ORAL | 0 refills | Status: AC

## 2016-04-03 MED ORDER — ONDANSETRON HCL 4 MG PO TABS: 4.0000 mg | ORAL_TABLET | Freq: Three times a day (TID) | ORAL | 0 refills | Status: DC | PRN

## 2016-04-03 MED FILL — DOK 100 MG SOFTGEL: 100 | 50 days supply | Qty: 100 | Fill #0

## 2016-04-03 MED FILL — MUPIROCIN 2% CREAM: 2 | 30 days supply | Qty: 30 | Fill #0

## 2016-04-03 MED FILL — ONDANSETRON HCL 4 MG TABLET: 4 | 7 days supply | Qty: 20 | Fill #0

## 2016-04-03 MED FILL — oxyCODONE HCL 5 MG/5ML SOLN: 5 | 6 days supply | Qty: 60 | Fill #0

## 2016-04-03 MED FILL — CEPHALEXIN 500 MG CAPSULE: 500 | 5 days supply | Qty: 15 | Fill #0

## 2016-04-03 NOTE — ED Triage Notes (Signed)
Pt was in a hurry, tripped and fell.  Hit head.  Has laceration to top of head, bleeding controlled.  No LOC.  Pt feels uncomfortable.

## 2016-04-03 NOTE — Discharge Instructions (Signed)
You can take up to 5 mL (5 mg) of the oxycodone liquid. Each one ML has 1 mg of oxycodone. I would recommend starting at 1 mL per dose and increasing if you tolerated it well. You can take your Zofran before the dose and make sure you take the dose with food to prevent nausea.  Take the Colace while you are taking your oxycodone to prevent constipation  Apply the antibiotic ointment to your staples twice daily until healed. Staples should be removed in 10 days. This can be done at your primary care doctor or return to the ED.  If you develop any worsening numbness or tingling in her arms or legs, return to the emergency department.  Present to the spine clinic immediately after discharge for your brace.

## 2016-04-03 NOTE — ED Notes (Signed)
[facility] states [facility] care.  Will give order to follow up and treat under Dr. Kelton Pillar.

## 2016-04-03 NOTE — ED Notes (Signed)
Doctor returned call to Dr. Bland Span on patient to send patient to office for special brace.  [facility] was cancelled out for transfer.

## 2016-04-03 NOTE — ED Provider Notes (Addendum)
[facility] Provider Note   CSN: [id number] Arrival date & time: 04/03/16  [id number]     History   Chief Complaint Chief Complaint  Patient presents with  . Fall    HPI Sarah Fletcher is a 82 y.o. female.  HPI 82 year old female who presents with fall. Patient was walking in her house today when she tripped over her carpet. She denies any preceding or subsequent chest pain, palpitations, or other symptoms. She fell forward and struck her head directly on the wall. There was no loss of consciousness. She is able to get herself up and has been ambulatory since then. However, she endorses a mild, generalized headache as well as upper and lower cervical neck pain. The pain is a constant, aching, throbbing pain. Denies any upper extremity or lower extremity numbness or weakness. She is not on blood thinners.  Past Medical History:  Diagnosis Date  . Arthritis   . Hypothyroid   . Osteopenia     There are no active problems to display for this patient.   Past Surgical History:  Procedure Laterality Date  . ABDOMINAL HYSTERECTOMY    . OTHER SURGICAL HISTORY     multiple ortho injuries fx   . REPLACEMENT TOTAL KNEE    . TONSILLECTOMY      OB History    No data available       Home Medications    Prior to Admission medications   Medication Sig Start Date End Date Taking? Authorizing Provider  denosumab (PROLIA) 60 MG/ML SOLN injection Inject 60 mg into the skin every 6 (six) months. Administer in upper arm, thigh, or abdomen   Yes Historical Provider, MD  FLUoxetine (PROZAC) 10 MG tablet Take 10 mg by mouth daily.   Yes Historical Provider, MD  Lactobacillus Rhamnosus, GG, (CULTURELLE) CAPS Take 1 capsule by mouth 2 times daily at 12 noon and 4 pm. 07/20/14  Yes April Palumbo, MD  levothyroxine (SYNTHROID, LEVOTHROID) 50 MCG tablet Take 50 mcg by mouth daily before breakfast.   Yes Historical Provider, MD  meloxicam (MOBIC) 7.5 MG tablet Take 1 tablet (7.5 mg total)  by mouth daily. 07/20/14  Yes April Palumbo, MD  cephALEXin (KEFLEX) 500 MG capsule Take 1 capsule (500 mg total) by mouth 3 (three) times daily. 04/03/16 04/08/16  Duffy Bruce, MD  docusate sodium (COLACE) 100 MG capsule Take 1 capsule (100 mg total) by mouth 2 (two) times daily. 04/03/16 04/23/16  Duffy Bruce, MD  mupirocin cream (BACTROBAN) 2 % Apply 1 application topically 2 (two) times daily. 04/03/16   Duffy Bruce, MD  ondansetron (ZOFRAN) 4 MG tablet Take 1 tablet (4 mg total) by mouth every 8 (eight) hours as needed for nausea or vomiting. 04/03/16   Duffy Bruce, MD  oxyCODONE (ROXICODONE) 5 MG/5ML solution Take 2.5 mLs (2.5 mg total) by mouth every 6 (six) hours as needed for breakthrough pain. 04/03/16   Duffy Bruce, MD  phenazopyridine (PYRIDIUM) 200 MG tablet Take 1 tablet (200 mg total) by mouth 3 (three) times daily. 07/19/14   April Palumbo, MD    Family History No family history on file.  Social History Social History  Substance Use Topics  . Smoking status: Never Smoker  . Smokeless tobacco: Never Used  . Alcohol use Yes     Comment: occasional      Allergies   Percocet [oxycodone-acetaminophen]   Review of Systems Review of Systems  Constitutional: Negative for chills and fever.  HENT: Negative for congestion,  rhinorrhea and sore throat.   Eyes: Negative for visual disturbance.  Respiratory: Negative for cough, shortness of breath and wheezing.   Cardiovascular: Negative for chest pain and leg swelling.  Gastrointestinal: Negative for abdominal pain, diarrhea, nausea and vomiting.  Genitourinary: Negative for dysuria, flank pain, vaginal bleeding and vaginal discharge.  Musculoskeletal: Positive for neck pain. Negative for neck stiffness.  Skin: Positive for wound. Negative for rash.  Allergic/Immunologic: Negative for immunocompromised state.  Neurological: Negative for syncope and headaches.  Hematological: Does not bruise/bleed easily.  All  other systems reviewed and are negative.    Physical Exam Updated Vital Signs BP 157/75 (BP Location: Right Arm)   Pulse 70   Temp 98 F (36.7 C)   Resp 18   Ht 5' (1.524 m)   Wt 129 lb (58.5 kg)   SpO2 100%   BMI 25.19 kg/m   Physical Exam  Constitutional: She is oriented to person, place, and time. She appears well-developed and well-nourished. No distress.  HENT:  Head: Normocephalic and atraumatic.  Approximate 6 cm laceration to the frontal scalp. No extension to the deeper tissue or galea. Mild, venous bleeding. No pulsatile bleeding. No palpable skull deformity. No Battle sign or preauricular or periorbital bruising.  Eyes: Conjunctivae are normal.  Neck:  Placed in cervical spine precautions on arrival. Mild upper and lower midline and paraspinal neck tenderness. No step-offs or deformity.  Cardiovascular: Normal rate, regular rhythm and normal heart sounds.  Exam reveals no friction rub.   No murmur heard. Pulmonary/Chest: Effort normal and breath sounds normal. No respiratory distress. She has no wheezes. She has no rales.  Abdominal: She exhibits no distension.  Musculoskeletal: She exhibits no edema.  Neurological: She is alert and oriented to person, place, and time. She exhibits normal muscle tone.  Strength 5 out of 5 in proximal and distal upper and lower extremities bilaterally. Normal sensation to light touch in distal upper and lower extremities bilaterally. Reflexes 2+ and symmetric.  Skin: Skin is warm. Capillary refill takes less than 2 seconds.  Psychiatric: She has a normal mood and affect.  Nursing note and vitals reviewed.    ED Treatments / Results  Labs (all labs ordered are listed, but only abnormal results are displayed) Labs Reviewed - No data to display  EKG  EKG Interpretation None       Radiology Ct Head Wo Contrast  Result Date: 04/03/2016 CLINICAL DATA:  Patient states that she tripped this morning slamming her head into a  wall, has laceration to proximal area of frontal region, c/o pains and pressure to right side of the back of her head EXAM: CT HEAD WITHOUT CONTRAST CT CERVICAL SPINE WITHOUT CONTRAST TECHNIQUE: Multidetector CT imaging of the head and cervical spine was performed following the standard protocol without intravenous contrast. Multiplanar CT image reconstructions of the cervical spine were also generated. COMPARISON:  None. FINDINGS: CT HEAD FINDINGS Brain: No evidence of acute infarction, hemorrhage, extra-axial collection, ventriculomegaly, or mass effect. Generalized cerebral atrophy. Periventricular white matter low attenuation likely secondary to microangiopathy. Vascular: Cerebrovascular atherosclerotic calcifications are noted. Skull: Negative for fracture or focal lesion. Sinuses/Orbits: Visualized portions of the orbits are unremarkable. Mild mucosal thickening in the left sphenoid sinus. Other: None. CT CERVICAL SPINE FINDINGS Alignment: Normal. Skull base and vertebrae: Acute compression fracture with approximately 60% height loss of the T2 vertebral body with 3 mm of retropulsion of the posterior inferior margin of the T2 vertebral body. No primary bone lesion or focal  pathologic process. Soft tissues and spinal canal: No prevertebral fluid or swelling. No visible canal hematoma. Disc levels: Degenerative disc disease with disc height loss at C3-4. Severe degenerative disc disease with disc height loss at C4-5, C5-6, C6-7. Severe left facet arthropathy at C3-4. At C4-5 there is moderate left and mild right facet arthropathy with bilateral foraminal narrowing. At C5-6 there is a broad-based disc osteophyte complex and moderate bilateral facet arthropathy with bilateral foraminal stenosis. At C5-6 there is a broad-based disc osteophyte complex with bilateral facet arthropathy and bilateral foraminal stenosis. Bilateral facet arthropathy at C7-T1, T1-2 and T2-3. Upper chest: Lung apices are clear. Other: No  fluid collection or hematoma. IMPRESSION: 1. No acute intracranial pathology. 2. Acute compression fracture with approximately 60% height loss of the T2 vertebral body with 3 mm of retropulsion of the posterior inferior margin of the T2 vertebral body. Electronically Signed   By: Kathreen Devoid   On: 04/03/2016 10:00   Ct Cervical Spine Wo Contrast  Result Date: 04/03/2016 CLINICAL DATA:  Patient states that she tripped this morning slamming her head into a wall, has laceration to proximal area of frontal region, c/o pains and pressure to right side of the back of her head EXAM: CT HEAD WITHOUT CONTRAST CT CERVICAL SPINE WITHOUT CONTRAST TECHNIQUE: Multidetector CT imaging of the head and cervical spine was performed following the standard protocol without intravenous contrast. Multiplanar CT image reconstructions of the cervical spine were also generated. COMPARISON:  None. FINDINGS: CT HEAD FINDINGS Brain: No evidence of acute infarction, hemorrhage, extra-axial collection, ventriculomegaly, or mass effect. Generalized cerebral atrophy. Periventricular white matter low attenuation likely secondary to microangiopathy. Vascular: Cerebrovascular atherosclerotic calcifications are noted. Skull: Negative for fracture or focal lesion. Sinuses/Orbits: Visualized portions of the orbits are unremarkable. Mild mucosal thickening in the left sphenoid sinus. Other: None. CT CERVICAL SPINE FINDINGS Alignment: Normal. Skull base and vertebrae: Acute compression fracture with approximately 60% height loss of the T2 vertebral body with 3 mm of retropulsion of the posterior inferior margin of the T2 vertebral body. No primary bone lesion or focal pathologic process. Soft tissues and spinal canal: No prevertebral fluid or swelling. No visible canal hematoma. Disc levels: Degenerative disc disease with disc height loss at C3-4. Severe degenerative disc disease with disc height loss at C4-5, C5-6, C6-7. Severe left facet  arthropathy at C3-4. At C4-5 there is moderate left and mild right facet arthropathy with bilateral foraminal narrowing. At C5-6 there is a broad-based disc osteophyte complex and moderate bilateral facet arthropathy with bilateral foraminal stenosis. At C5-6 there is a broad-based disc osteophyte complex with bilateral facet arthropathy and bilateral foraminal stenosis. Bilateral facet arthropathy at C7-T1, T1-2 and T2-3. Upper chest: Lung apices are clear. Other: No fluid collection or hematoma. IMPRESSION: 1. No acute intracranial pathology. 2. Acute compression fracture with approximately 60% height loss of the T2 vertebral body with 3 mm of retropulsion of the posterior inferior margin of the T2 vertebral body. Electronically Signed   By: Kathreen Devoid   On: 04/03/2016 10:00    Procedures .Marland KitchenLaceration Repair Date/Time: 04/03/2016 5:31 PM Performed by: Duffy Bruce Authorized by: Duffy Bruce   Consent:    Consent obtained:  Verbal   Consent given by:  Patient   Risks discussed:  Pain, infection, need for additional repair, poor cosmetic result, poor wound healing, nerve damage, vascular damage, tendon damage and retained foreign body   Alternatives discussed:  Delayed treatment and no treatment Anesthesia (see MAR for exact  dosages):    Anesthesia method:  Local infiltration   Local anesthetic:  Lidocaine 2% WITH epi Laceration details:    Location:  Scalp   Scalp location:  Frontal   Length (cm):  6   Depth (mm):  2 Repair type:    Repair type:  Simple Pre-procedure details:    Preparation:  Patient was prepped and draped in usual sterile fashion and imaging obtained to evaluate for foreign bodies Exploration:    Hemostasis achieved with:  Direct pressure   Wound exploration: wound explored through full range of motion and entire depth of wound probed and visualized     Wound extent: no foreign bodies/material noted, no muscle damage noted, no nerve damage noted, no tendon  damage noted, no underlying fracture noted and no vascular damage noted   Treatment:    Area cleansed with:  Betadine   Amount of cleaning:  Extensive   Irrigation solution:  Sterile saline   Irrigation volume:  500   Irrigation method:  Pressure wash and syringe Skin repair:    Repair method:  Staples   Number of staples:  12 Approximation:    Approximation:  Close   Vermilion border: well-aligned   Post-procedure details:    Dressing:  Antibiotic ointment   Patient tolerance of procedure:  Tolerated well, no immediate complications    (including critical care time)  Medications Ordered in ED Medications  lidocaine-EPINEPHrine (XYLOCAINE W/EPI) 2 %-1:100000 (with pres) injection (  Given 04/03/16 1227)  ondansetron (ZOFRAN-ODT) disintegrating tablet 4 mg (4 mg Oral Given 04/03/16 1312)  HYDROcodone-acetaminophen (HYCET) 7.5-325 mg/15 ml solution 5 mL (5 mLs Oral Given 04/03/16 1333)     Initial Impression / Assessment and Plan / ED Course  I have reviewed the triage vital signs and the nursing notes.  Pertinent labs & imaging results that were available during my care of the patient were reviewed by me and considered in my medical decision making (see chart for details).  Clinical Course    82 year old female who presents with head laceration and neck pain after mechanical fall. There was no loss of consciousness. Exam is as above. CT head shows no acute abnormality and laceration was rinsed and closed as above. C-spine imaging shows acute C1 as well as T2 fracture. Patient placed in cervical collar and was maintained on cervical spine precautions throughout her ED stay. She has no upper extremity weakness, paresthesias, or signs of central cord compression or syndrome. No evidence of acute radiculopathy. Discussed case with Dr. Cyndy Freeze of neurosurgery. Will place in CTO and advised outpatient follow-up. Pain is controlled. Discussed with brace specialist. She will follow up in  clinic immediately after discharge for fitting of brace. Per Dr. Cyndy Freeze., This is okay and patient is stable and okay to ambulate with cervical brace only until clinic. She is otherwise well-appearing. As mentioned, she had no loss of consciousness, chest pain, and fall was purely mechanical. She has been hemodynamically stable throughout her stay. She does have reported nausea with pain meds so will give liquid oxycodone so she can titrate at home along with Zofran. Otherwise, I offered home health as well as possible admission with the patient and she has strong support at home and would like to attempt to manage her symptoms at home. Her 2 daughters, who are with her, will help her at home and set up outpatient home health with her insurer. Return precautions were given. Patient discharged to have her CTO brace placed. She was ambulatory  in the ED with well-controlled pain. Will follow up with her PCP in 10-14 days for staple removal. Prophylactic antibiotics given.  Final Clinical Impressions(s) / ED Diagnoses   Final diagnoses:  Other closed nondisplaced fracture of first cervical vertebra, initial encounter [facility])  Closed wedge compression fracture of second thoracic vertebra, initial encounter ([facility])  Fall, initial encounter  Laceration of scalp, initial encounter    New Prescriptions Discharge Medication List as of 04/03/2016 12:54 PM    START taking these medications   Details  docusate sodium (COLACE) 100 MG capsule Take 1 capsule (100 mg total) by mouth 2 (two) times daily., Starting Tue 04/03/2016, Until Mon 04/23/2016, Print    mupirocin cream (BACTROBAN) 2 % Apply 1 application topically 2 (two) times daily., Starting Tue 04/03/2016, Print    ondansetron (ZOFRAN) 4 MG tablet Take 1 tablet (4 mg total) by mouth every 8 (eight) hours as needed for nausea or vomiting., Starting Tue 04/03/2016, Print    oxyCODONE (ROXICODONE) 5 MG/5ML solution Take 2.5 mLs (2.5 mg total) by mouth every 6  (six) hours as needed for breakthrough pain., Starting Tue 04/03/2016, Print         Duffy Bruce, MD 04/03/16 1729    Duffy Bruce, MD 04/03/16 [id number]

## 2016-04-04 DIAGNOSIS — R293 Abnormal posture: Secondary | ICD-10-CM | POA: Diagnosis not present

## 2016-04-04 DIAGNOSIS — Z7389 Other problems related to life management difficulty: Secondary | ICD-10-CM | POA: Diagnosis not present

## 2016-04-04 DIAGNOSIS — S22020D Wedge compression fracture of second thoracic vertebra, subsequent encounter for fracture with routine healing: Secondary | ICD-10-CM | POA: Diagnosis not present

## 2016-04-04 DIAGNOSIS — R2681 Unsteadiness on feet: Secondary | ICD-10-CM | POA: Diagnosis not present

## 2016-04-04 DIAGNOSIS — S12091D Other nondisplaced fracture of first cervical vertebra, subsequent encounter for fracture with routine healing: Secondary | ICD-10-CM | POA: Diagnosis not present

## 2016-04-04 DIAGNOSIS — Z9181 History of falling: Secondary | ICD-10-CM | POA: Diagnosis not present

## 2016-04-05 DIAGNOSIS — R110 Nausea: Secondary | ICD-10-CM | POA: Diagnosis not present

## 2016-04-05 DIAGNOSIS — M8580 Other specified disorders of bone density and structure, unspecified site: Secondary | ICD-10-CM | POA: Diagnosis not present

## 2016-04-05 DIAGNOSIS — F320 Major depressive disorder, single episode, mild: Secondary | ICD-10-CM | POA: Diagnosis not present

## 2016-04-05 DIAGNOSIS — E039 Hypothyroidism, unspecified: Secondary | ICD-10-CM | POA: Diagnosis not present

## 2016-04-05 DIAGNOSIS — R262 Difficulty in walking, not elsewhere classified: Secondary | ICD-10-CM | POA: Diagnosis not present

## 2016-04-05 DIAGNOSIS — M1991 Primary osteoarthritis, unspecified site: Secondary | ICD-10-CM | POA: Diagnosis not present

## 2016-04-05 DIAGNOSIS — R293 Abnormal posture: Secondary | ICD-10-CM | POA: Diagnosis not present

## 2016-04-05 DIAGNOSIS — S22020D Wedge compression fracture of second thoracic vertebra, subsequent encounter for fracture with routine healing: Secondary | ICD-10-CM | POA: Diagnosis not present

## 2016-04-05 DIAGNOSIS — Z9181 History of falling: Secondary | ICD-10-CM | POA: Diagnosis not present

## 2016-04-05 DIAGNOSIS — R2681 Unsteadiness on feet: Secondary | ICD-10-CM | POA: Diagnosis not present

## 2016-04-05 DIAGNOSIS — M50321 Other cervical disc degeneration at C4-C5 level: Secondary | ICD-10-CM | POA: Diagnosis not present

## 2016-04-05 DIAGNOSIS — S12001D Unspecified nondisplaced fracture of first cervical vertebra, subsequent encounter for fracture with routine healing: Secondary | ICD-10-CM | POA: Diagnosis not present

## 2016-04-05 DIAGNOSIS — S12091D Other nondisplaced fracture of first cervical vertebra, subsequent encounter for fracture with routine healing: Secondary | ICD-10-CM | POA: Diagnosis not present

## 2016-04-05 DIAGNOSIS — Z7389 Other problems related to life management difficulty: Secondary | ICD-10-CM | POA: Diagnosis not present

## 2016-04-06 DIAGNOSIS — S12091D Other nondisplaced fracture of first cervical vertebra, subsequent encounter for fracture with routine healing: Secondary | ICD-10-CM | POA: Diagnosis not present

## 2016-04-06 DIAGNOSIS — R293 Abnormal posture: Secondary | ICD-10-CM | POA: Diagnosis not present

## 2016-04-06 DIAGNOSIS — S22020D Wedge compression fracture of second thoracic vertebra, subsequent encounter for fracture with routine healing: Secondary | ICD-10-CM | POA: Diagnosis not present

## 2016-04-06 DIAGNOSIS — Z9181 History of falling: Secondary | ICD-10-CM | POA: Diagnosis not present

## 2016-04-06 DIAGNOSIS — Z7389 Other problems related to life management difficulty: Secondary | ICD-10-CM | POA: Diagnosis not present

## 2016-04-06 DIAGNOSIS — R2681 Unsteadiness on feet: Secondary | ICD-10-CM | POA: Diagnosis not present

## 2016-04-07 DIAGNOSIS — R2681 Unsteadiness on feet: Secondary | ICD-10-CM | POA: Diagnosis not present

## 2016-04-07 DIAGNOSIS — S12091D Other nondisplaced fracture of first cervical vertebra, subsequent encounter for fracture with routine healing: Secondary | ICD-10-CM | POA: Diagnosis not present

## 2016-04-07 DIAGNOSIS — Z7389 Other problems related to life management difficulty: Secondary | ICD-10-CM | POA: Diagnosis not present

## 2016-04-07 DIAGNOSIS — S22020D Wedge compression fracture of second thoracic vertebra, subsequent encounter for fracture with routine healing: Secondary | ICD-10-CM | POA: Diagnosis not present

## 2016-04-07 DIAGNOSIS — Z9181 History of falling: Secondary | ICD-10-CM | POA: Diagnosis not present

## 2016-04-07 DIAGNOSIS — R293 Abnormal posture: Secondary | ICD-10-CM | POA: Diagnosis not present

## 2016-04-09 DIAGNOSIS — S12091D Other nondisplaced fracture of first cervical vertebra, subsequent encounter for fracture with routine healing: Secondary | ICD-10-CM | POA: Diagnosis not present

## 2016-04-09 DIAGNOSIS — R293 Abnormal posture: Secondary | ICD-10-CM | POA: Diagnosis not present

## 2016-04-09 DIAGNOSIS — S22020D Wedge compression fracture of second thoracic vertebra, subsequent encounter for fracture with routine healing: Secondary | ICD-10-CM | POA: Diagnosis not present

## 2016-04-09 DIAGNOSIS — Z9181 History of falling: Secondary | ICD-10-CM | POA: Diagnosis not present

## 2016-04-09 DIAGNOSIS — R2681 Unsteadiness on feet: Secondary | ICD-10-CM | POA: Diagnosis not present

## 2016-04-09 DIAGNOSIS — Z7389 Other problems related to life management difficulty: Secondary | ICD-10-CM | POA: Diagnosis not present

## 2016-04-10 DIAGNOSIS — S12091D Other nondisplaced fracture of first cervical vertebra, subsequent encounter for fracture with routine healing: Secondary | ICD-10-CM | POA: Diagnosis not present

## 2016-04-10 DIAGNOSIS — Z7389 Other problems related to life management difficulty: Secondary | ICD-10-CM | POA: Diagnosis not present

## 2016-04-10 DIAGNOSIS — R2681 Unsteadiness on feet: Secondary | ICD-10-CM | POA: Diagnosis not present

## 2016-04-10 DIAGNOSIS — S22020D Wedge compression fracture of second thoracic vertebra, subsequent encounter for fracture with routine healing: Secondary | ICD-10-CM | POA: Diagnosis not present

## 2016-04-10 DIAGNOSIS — Z9181 History of falling: Secondary | ICD-10-CM | POA: Diagnosis not present

## 2016-04-10 DIAGNOSIS — R293 Abnormal posture: Secondary | ICD-10-CM | POA: Diagnosis not present

## 2016-04-11 DIAGNOSIS — Z9181 History of falling: Secondary | ICD-10-CM | POA: Diagnosis not present

## 2016-04-11 DIAGNOSIS — R293 Abnormal posture: Secondary | ICD-10-CM | POA: Diagnosis not present

## 2016-04-11 DIAGNOSIS — S22020D Wedge compression fracture of second thoracic vertebra, subsequent encounter for fracture with routine healing: Secondary | ICD-10-CM | POA: Diagnosis not present

## 2016-04-11 DIAGNOSIS — R2681 Unsteadiness on feet: Secondary | ICD-10-CM | POA: Diagnosis not present

## 2016-04-11 DIAGNOSIS — S12091D Other nondisplaced fracture of first cervical vertebra, subsequent encounter for fracture with routine healing: Secondary | ICD-10-CM | POA: Diagnosis not present

## 2016-04-11 DIAGNOSIS — Z7389 Other problems related to life management difficulty: Secondary | ICD-10-CM | POA: Diagnosis not present

## 2016-04-12 DIAGNOSIS — R293 Abnormal posture: Secondary | ICD-10-CM | POA: Diagnosis not present

## 2016-04-12 DIAGNOSIS — R2681 Unsteadiness on feet: Secondary | ICD-10-CM | POA: Diagnosis not present

## 2016-04-12 DIAGNOSIS — Z9181 History of falling: Secondary | ICD-10-CM | POA: Diagnosis not present

## 2016-04-12 DIAGNOSIS — S12091D Other nondisplaced fracture of first cervical vertebra, subsequent encounter for fracture with routine healing: Secondary | ICD-10-CM | POA: Diagnosis not present

## 2016-04-12 DIAGNOSIS — Z7389 Other problems related to life management difficulty: Secondary | ICD-10-CM | POA: Diagnosis not present

## 2016-04-12 DIAGNOSIS — S22020D Wedge compression fracture of second thoracic vertebra, subsequent encounter for fracture with routine healing: Secondary | ICD-10-CM | POA: Diagnosis not present

## 2016-04-13 DIAGNOSIS — R293 Abnormal posture: Secondary | ICD-10-CM | POA: Diagnosis not present

## 2016-04-13 DIAGNOSIS — S22020D Wedge compression fracture of second thoracic vertebra, subsequent encounter for fracture with routine healing: Secondary | ICD-10-CM | POA: Diagnosis not present

## 2016-04-13 DIAGNOSIS — S12091D Other nondisplaced fracture of first cervical vertebra, subsequent encounter for fracture with routine healing: Secondary | ICD-10-CM | POA: Diagnosis not present

## 2016-04-13 DIAGNOSIS — Z9181 History of falling: Secondary | ICD-10-CM | POA: Diagnosis not present

## 2016-04-13 DIAGNOSIS — R2681 Unsteadiness on feet: Secondary | ICD-10-CM | POA: Diagnosis not present

## 2016-04-13 DIAGNOSIS — Z7389 Other problems related to life management difficulty: Secondary | ICD-10-CM | POA: Diagnosis not present

## 2016-04-16 DIAGNOSIS — S12091D Other nondisplaced fracture of first cervical vertebra, subsequent encounter for fracture with routine healing: Secondary | ICD-10-CM | POA: Diagnosis not present

## 2016-04-16 DIAGNOSIS — Z7389 Other problems related to life management difficulty: Secondary | ICD-10-CM | POA: Diagnosis not present

## 2016-04-16 DIAGNOSIS — R2681 Unsteadiness on feet: Secondary | ICD-10-CM | POA: Diagnosis not present

## 2016-04-16 DIAGNOSIS — R293 Abnormal posture: Secondary | ICD-10-CM | POA: Diagnosis not present

## 2016-04-16 DIAGNOSIS — S22020D Wedge compression fracture of second thoracic vertebra, subsequent encounter for fracture with routine healing: Secondary | ICD-10-CM | POA: Diagnosis not present

## 2016-04-16 DIAGNOSIS — Z9181 History of falling: Secondary | ICD-10-CM | POA: Diagnosis not present

## 2016-04-17 DIAGNOSIS — S22020D Wedge compression fracture of second thoracic vertebra, subsequent encounter for fracture with routine healing: Secondary | ICD-10-CM | POA: Diagnosis not present

## 2016-04-17 DIAGNOSIS — R2681 Unsteadiness on feet: Secondary | ICD-10-CM | POA: Diagnosis not present

## 2016-04-17 DIAGNOSIS — Z7389 Other problems related to life management difficulty: Secondary | ICD-10-CM | POA: Diagnosis not present

## 2016-04-17 DIAGNOSIS — Z9181 History of falling: Secondary | ICD-10-CM | POA: Diagnosis not present

## 2016-04-17 DIAGNOSIS — S12091D Other nondisplaced fracture of first cervical vertebra, subsequent encounter for fracture with routine healing: Secondary | ICD-10-CM | POA: Diagnosis not present

## 2016-04-17 DIAGNOSIS — R293 Abnormal posture: Secondary | ICD-10-CM | POA: Diagnosis not present

## 2016-04-18 DIAGNOSIS — R2681 Unsteadiness on feet: Secondary | ICD-10-CM | POA: Diagnosis not present

## 2016-04-18 DIAGNOSIS — S22020D Wedge compression fracture of second thoracic vertebra, subsequent encounter for fracture with routine healing: Secondary | ICD-10-CM | POA: Diagnosis not present

## 2016-04-18 DIAGNOSIS — S12091D Other nondisplaced fracture of first cervical vertebra, subsequent encounter for fracture with routine healing: Secondary | ICD-10-CM | POA: Diagnosis not present

## 2016-04-18 DIAGNOSIS — R293 Abnormal posture: Secondary | ICD-10-CM | POA: Diagnosis not present

## 2016-04-18 DIAGNOSIS — Z9181 History of falling: Secondary | ICD-10-CM | POA: Diagnosis not present

## 2016-04-18 DIAGNOSIS — Z7389 Other problems related to life management difficulty: Secondary | ICD-10-CM | POA: Diagnosis not present

## 2016-04-19 DIAGNOSIS — Z9181 History of falling: Secondary | ICD-10-CM | POA: Diagnosis not present

## 2016-04-19 DIAGNOSIS — R293 Abnormal posture: Secondary | ICD-10-CM | POA: Diagnosis not present

## 2016-04-19 DIAGNOSIS — S22020D Wedge compression fracture of second thoracic vertebra, subsequent encounter for fracture with routine healing: Secondary | ICD-10-CM | POA: Diagnosis not present

## 2016-04-19 DIAGNOSIS — R2681 Unsteadiness on feet: Secondary | ICD-10-CM | POA: Diagnosis not present

## 2016-04-19 DIAGNOSIS — Z7389 Other problems related to life management difficulty: Secondary | ICD-10-CM | POA: Diagnosis not present

## 2016-04-19 DIAGNOSIS — S12091D Other nondisplaced fracture of first cervical vertebra, subsequent encounter for fracture with routine healing: Secondary | ICD-10-CM | POA: Diagnosis not present

## 2016-04-20 DIAGNOSIS — S22020D Wedge compression fracture of second thoracic vertebra, subsequent encounter for fracture with routine healing: Secondary | ICD-10-CM | POA: Diagnosis not present

## 2016-04-20 DIAGNOSIS — R293 Abnormal posture: Secondary | ICD-10-CM | POA: Diagnosis not present

## 2016-04-20 DIAGNOSIS — Z9181 History of falling: Secondary | ICD-10-CM | POA: Diagnosis not present

## 2016-04-20 DIAGNOSIS — Z7389 Other problems related to life management difficulty: Secondary | ICD-10-CM | POA: Diagnosis not present

## 2016-04-20 DIAGNOSIS — S12091D Other nondisplaced fracture of first cervical vertebra, subsequent encounter for fracture with routine healing: Secondary | ICD-10-CM | POA: Diagnosis not present

## 2016-04-20 DIAGNOSIS — R2681 Unsteadiness on feet: Secondary | ICD-10-CM | POA: Diagnosis not present

## 2016-04-23 DIAGNOSIS — R293 Abnormal posture: Secondary | ICD-10-CM | POA: Diagnosis not present

## 2016-04-23 DIAGNOSIS — Z9181 History of falling: Secondary | ICD-10-CM | POA: Diagnosis not present

## 2016-04-23 DIAGNOSIS — S22020D Wedge compression fracture of second thoracic vertebra, subsequent encounter for fracture with routine healing: Secondary | ICD-10-CM | POA: Diagnosis not present

## 2016-04-23 DIAGNOSIS — Z7389 Other problems related to life management difficulty: Secondary | ICD-10-CM | POA: Diagnosis not present

## 2016-04-23 DIAGNOSIS — R2681 Unsteadiness on feet: Secondary | ICD-10-CM | POA: Diagnosis not present

## 2016-04-23 DIAGNOSIS — S12091D Other nondisplaced fracture of first cervical vertebra, subsequent encounter for fracture with routine healing: Secondary | ICD-10-CM | POA: Diagnosis not present

## 2016-04-24 DIAGNOSIS — S22020D Wedge compression fracture of second thoracic vertebra, subsequent encounter for fracture with routine healing: Secondary | ICD-10-CM | POA: Diagnosis not present

## 2016-04-24 DIAGNOSIS — S22020A Wedge compression fracture of second thoracic vertebra, initial encounter for closed fracture: Secondary | ICD-10-CM | POA: Diagnosis not present

## 2016-04-24 DIAGNOSIS — Z9181 History of falling: Secondary | ICD-10-CM | POA: Diagnosis not present

## 2016-04-24 DIAGNOSIS — S12091D Other nondisplaced fracture of first cervical vertebra, subsequent encounter for fracture with routine healing: Secondary | ICD-10-CM | POA: Diagnosis not present

## 2016-04-24 DIAGNOSIS — R2681 Unsteadiness on feet: Secondary | ICD-10-CM | POA: Diagnosis not present

## 2016-04-24 DIAGNOSIS — R293 Abnormal posture: Secondary | ICD-10-CM | POA: Diagnosis not present

## 2016-04-24 DIAGNOSIS — Z7389 Other problems related to life management difficulty: Secondary | ICD-10-CM | POA: Diagnosis not present

## 2016-04-25 DIAGNOSIS — R2681 Unsteadiness on feet: Secondary | ICD-10-CM | POA: Diagnosis not present

## 2016-04-25 DIAGNOSIS — Z7389 Other problems related to life management difficulty: Secondary | ICD-10-CM | POA: Diagnosis not present

## 2016-04-25 DIAGNOSIS — Z9181 History of falling: Secondary | ICD-10-CM | POA: Diagnosis not present

## 2016-04-25 DIAGNOSIS — S12091D Other nondisplaced fracture of first cervical vertebra, subsequent encounter for fracture with routine healing: Secondary | ICD-10-CM | POA: Diagnosis not present

## 2016-04-25 DIAGNOSIS — S22020D Wedge compression fracture of second thoracic vertebra, subsequent encounter for fracture with routine healing: Secondary | ICD-10-CM | POA: Diagnosis not present

## 2016-04-30 DIAGNOSIS — S22020D Wedge compression fracture of second thoracic vertebra, subsequent encounter for fracture with routine healing: Secondary | ICD-10-CM | POA: Diagnosis not present

## 2016-04-30 DIAGNOSIS — Z9181 History of falling: Secondary | ICD-10-CM | POA: Diagnosis not present

## 2016-04-30 DIAGNOSIS — R293 Abnormal posture: Secondary | ICD-10-CM | POA: Diagnosis not present

## 2016-04-30 DIAGNOSIS — R2681 Unsteadiness on feet: Secondary | ICD-10-CM | POA: Diagnosis not present

## 2016-04-30 DIAGNOSIS — S12091D Other nondisplaced fracture of first cervical vertebra, subsequent encounter for fracture with routine healing: Secondary | ICD-10-CM | POA: Diagnosis not present

## 2016-05-02 DIAGNOSIS — S12091D Other nondisplaced fracture of first cervical vertebra, subsequent encounter for fracture with routine healing: Secondary | ICD-10-CM | POA: Diagnosis not present

## 2016-05-02 DIAGNOSIS — S22020D Wedge compression fracture of second thoracic vertebra, subsequent encounter for fracture with routine healing: Secondary | ICD-10-CM | POA: Diagnosis not present

## 2016-05-02 DIAGNOSIS — R2681 Unsteadiness on feet: Secondary | ICD-10-CM | POA: Diagnosis not present

## 2016-05-02 DIAGNOSIS — Z9181 History of falling: Secondary | ICD-10-CM | POA: Diagnosis not present

## 2016-05-02 DIAGNOSIS — R293 Abnormal posture: Secondary | ICD-10-CM | POA: Diagnosis not present

## 2016-05-03 DIAGNOSIS — R293 Abnormal posture: Secondary | ICD-10-CM | POA: Diagnosis not present

## 2016-05-03 DIAGNOSIS — R2681 Unsteadiness on feet: Secondary | ICD-10-CM | POA: Diagnosis not present

## 2016-05-03 DIAGNOSIS — S22020D Wedge compression fracture of second thoracic vertebra, subsequent encounter for fracture with routine healing: Secondary | ICD-10-CM | POA: Diagnosis not present

## 2016-05-03 DIAGNOSIS — Z9181 History of falling: Secondary | ICD-10-CM | POA: Diagnosis not present

## 2016-05-03 DIAGNOSIS — S12091D Other nondisplaced fracture of first cervical vertebra, subsequent encounter for fracture with routine healing: Secondary | ICD-10-CM | POA: Diagnosis not present

## 2016-05-07 DIAGNOSIS — R293 Abnormal posture: Secondary | ICD-10-CM | POA: Diagnosis not present

## 2016-05-07 DIAGNOSIS — S12091D Other nondisplaced fracture of first cervical vertebra, subsequent encounter for fracture with routine healing: Secondary | ICD-10-CM | POA: Diagnosis not present

## 2016-05-07 DIAGNOSIS — R2681 Unsteadiness on feet: Secondary | ICD-10-CM | POA: Diagnosis not present

## 2016-05-07 DIAGNOSIS — Z9181 History of falling: Secondary | ICD-10-CM | POA: Diagnosis not present

## 2016-05-07 DIAGNOSIS — S22020D Wedge compression fracture of second thoracic vertebra, subsequent encounter for fracture with routine healing: Secondary | ICD-10-CM | POA: Diagnosis not present

## 2016-05-09 DIAGNOSIS — Z9181 History of falling: Secondary | ICD-10-CM | POA: Diagnosis not present

## 2016-05-09 DIAGNOSIS — S22020D Wedge compression fracture of second thoracic vertebra, subsequent encounter for fracture with routine healing: Secondary | ICD-10-CM | POA: Diagnosis not present

## 2016-05-09 DIAGNOSIS — R2681 Unsteadiness on feet: Secondary | ICD-10-CM | POA: Diagnosis not present

## 2016-05-09 DIAGNOSIS — S12091D Other nondisplaced fracture of first cervical vertebra, subsequent encounter for fracture with routine healing: Secondary | ICD-10-CM | POA: Diagnosis not present

## 2016-05-09 DIAGNOSIS — R293 Abnormal posture: Secondary | ICD-10-CM | POA: Diagnosis not present

## 2016-05-11 DIAGNOSIS — R2681 Unsteadiness on feet: Secondary | ICD-10-CM | POA: Diagnosis not present

## 2016-05-11 DIAGNOSIS — R293 Abnormal posture: Secondary | ICD-10-CM | POA: Diagnosis not present

## 2016-05-11 DIAGNOSIS — S22020D Wedge compression fracture of second thoracic vertebra, subsequent encounter for fracture with routine healing: Secondary | ICD-10-CM | POA: Diagnosis not present

## 2016-05-11 DIAGNOSIS — S12091D Other nondisplaced fracture of first cervical vertebra, subsequent encounter for fracture with routine healing: Secondary | ICD-10-CM | POA: Diagnosis not present

## 2016-05-11 DIAGNOSIS — Z9181 History of falling: Secondary | ICD-10-CM | POA: Diagnosis not present

## 2016-05-14 DIAGNOSIS — S22020D Wedge compression fracture of second thoracic vertebra, subsequent encounter for fracture with routine healing: Secondary | ICD-10-CM | POA: Diagnosis not present

## 2016-05-14 DIAGNOSIS — S12091D Other nondisplaced fracture of first cervical vertebra, subsequent encounter for fracture with routine healing: Secondary | ICD-10-CM | POA: Diagnosis not present

## 2016-05-14 DIAGNOSIS — R293 Abnormal posture: Secondary | ICD-10-CM | POA: Diagnosis not present

## 2016-05-14 DIAGNOSIS — Z9181 History of falling: Secondary | ICD-10-CM | POA: Diagnosis not present

## 2016-05-14 DIAGNOSIS — R2681 Unsteadiness on feet: Secondary | ICD-10-CM | POA: Diagnosis not present

## 2016-05-15 DIAGNOSIS — R030 Elevated blood-pressure reading, without diagnosis of hypertension: Secondary | ICD-10-CM | POA: Diagnosis not present

## 2016-05-15 DIAGNOSIS — S22020A Wedge compression fracture of second thoracic vertebra, initial encounter for closed fracture: Secondary | ICD-10-CM | POA: Diagnosis not present

## 2016-05-15 DIAGNOSIS — Z6825 Body mass index (BMI) 25.0-25.9, adult: Secondary | ICD-10-CM | POA: Diagnosis not present

## 2016-05-16 DIAGNOSIS — S22020D Wedge compression fracture of second thoracic vertebra, subsequent encounter for fracture with routine healing: Secondary | ICD-10-CM | POA: Diagnosis not present

## 2016-05-16 DIAGNOSIS — R2681 Unsteadiness on feet: Secondary | ICD-10-CM | POA: Diagnosis not present

## 2016-05-16 DIAGNOSIS — S12091D Other nondisplaced fracture of first cervical vertebra, subsequent encounter for fracture with routine healing: Secondary | ICD-10-CM | POA: Diagnosis not present

## 2016-05-16 DIAGNOSIS — R293 Abnormal posture: Secondary | ICD-10-CM | POA: Diagnosis not present

## 2016-05-16 DIAGNOSIS — Z9181 History of falling: Secondary | ICD-10-CM | POA: Diagnosis not present

## 2016-05-21 DIAGNOSIS — Z9181 History of falling: Secondary | ICD-10-CM | POA: Diagnosis not present

## 2016-05-21 DIAGNOSIS — S12091D Other nondisplaced fracture of first cervical vertebra, subsequent encounter for fracture with routine healing: Secondary | ICD-10-CM | POA: Diagnosis not present

## 2016-05-21 DIAGNOSIS — S22020D Wedge compression fracture of second thoracic vertebra, subsequent encounter for fracture with routine healing: Secondary | ICD-10-CM | POA: Diagnosis not present

## 2016-05-21 DIAGNOSIS — R2681 Unsteadiness on feet: Secondary | ICD-10-CM | POA: Diagnosis not present

## 2016-05-21 DIAGNOSIS — R293 Abnormal posture: Secondary | ICD-10-CM | POA: Diagnosis not present

## 2016-07-13 DIAGNOSIS — H01001 Unspecified blepharitis right upper eyelid: Secondary | ICD-10-CM | POA: Diagnosis not present

## 2016-07-13 DIAGNOSIS — H43813 Vitreous degeneration, bilateral: Secondary | ICD-10-CM | POA: Diagnosis not present

## 2016-07-13 DIAGNOSIS — H04123 Dry eye syndrome of bilateral lacrimal glands: Secondary | ICD-10-CM | POA: Diagnosis not present

## 2016-07-13 DIAGNOSIS — H524 Presbyopia: Secondary | ICD-10-CM | POA: Diagnosis not present

## 2016-07-16 DIAGNOSIS — M109 Gout, unspecified: Secondary | ICD-10-CM | POA: Diagnosis not present

## 2016-07-16 DIAGNOSIS — S76019A Strain of muscle, fascia and tendon of unspecified hip, initial encounter: Secondary | ICD-10-CM | POA: Diagnosis not present

## 2016-07-16 DIAGNOSIS — M549 Dorsalgia, unspecified: Secondary | ICD-10-CM | POA: Diagnosis not present

## 2016-08-09 DIAGNOSIS — M79646 Pain in unspecified finger(s): Secondary | ICD-10-CM | POA: Diagnosis not present

## 2016-09-10 DIAGNOSIS — M8000XA Age-related osteoporosis with current pathological fracture, unspecified site, initial encounter for fracture: Secondary | ICD-10-CM | POA: Diagnosis not present

## 2016-09-10 DIAGNOSIS — Z0000 Encounter for general adult medical examination without abnormal findings: Secondary | ICD-10-CM | POA: Diagnosis not present

## 2016-09-10 DIAGNOSIS — E039 Hypothyroidism, unspecified: Secondary | ICD-10-CM | POA: Diagnosis not present

## 2016-09-10 DIAGNOSIS — E7800 Pure hypercholesterolemia, unspecified: Secondary | ICD-10-CM | POA: Diagnosis not present

## 2016-09-10 DIAGNOSIS — E559 Vitamin D deficiency, unspecified: Secondary | ICD-10-CM | POA: Diagnosis not present

## 2016-09-10 DIAGNOSIS — F39 Unspecified mood [affective] disorder: Secondary | ICD-10-CM | POA: Diagnosis not present

## 2016-10-25 DIAGNOSIS — Z1231 Encounter for screening mammogram for malignant neoplasm of breast: Secondary | ICD-10-CM | POA: Diagnosis not present

## 2017-02-19 DIAGNOSIS — W010XXA Fall on same level from slipping, tripping and stumbling without subsequent striking against object, initial encounter: Secondary | ICD-10-CM | POA: Diagnosis not present

## 2017-02-19 DIAGNOSIS — S80211A Abrasion, right knee, initial encounter: Secondary | ICD-10-CM | POA: Diagnosis not present

## 2017-03-14 DIAGNOSIS — M8000XA Age-related osteoporosis with current pathological fracture, unspecified site, initial encounter for fracture: Secondary | ICD-10-CM | POA: Diagnosis not present

## 2017-03-14 DIAGNOSIS — Z23 Encounter for immunization: Secondary | ICD-10-CM | POA: Diagnosis not present

## 2017-05-03 IMAGING — CT CT HEAD W/O CM
3 of 7 series · 13 of 47 positions shown, 15 images · non-contrast
Comparison: None.

CLINICAL DATA: Patient states that she tripped this morning
slamming her head into a wall, has laceration to proximal area of
frontal region, c/o pains and pressure to right side of the back of
her head

EXAM:
CT HEAD WITHOUT CONTRAST
CT CERVICAL SPINE WITHOUT CONTRAST
TECHNIQUE: Multidetector CT imaging of the head and cervical spine was
performed following the standard protocol without intravenous
contrast. Multiplanar CT image reconstructions of the cervical spine
were also generated.

[Series 9: coronals · coronal · 0.32mm/px · 3 of 88 slices shown]
[im 35/88  brain]
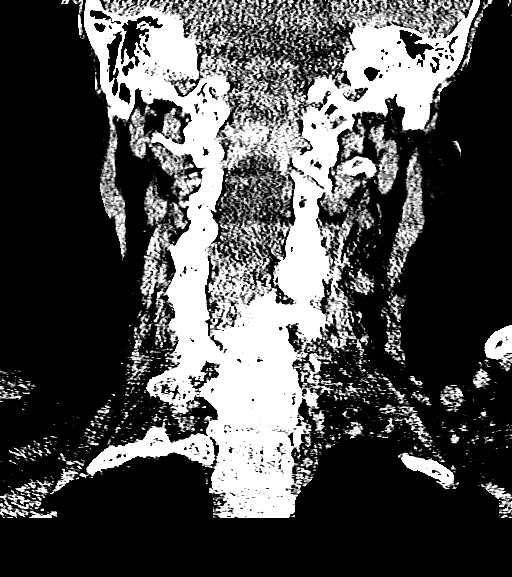
[im 52/88  brain]
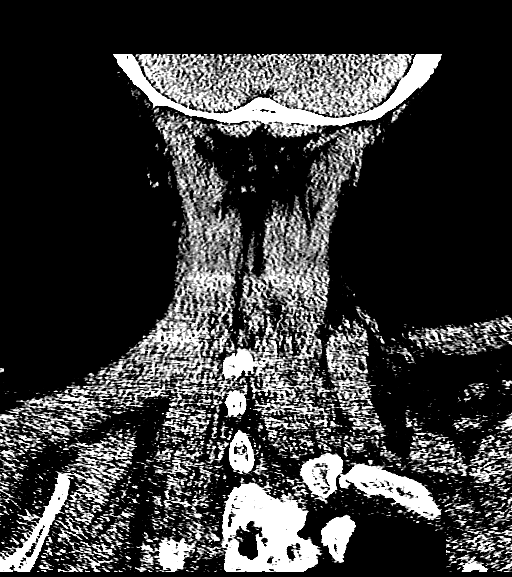
[im 70/88  brain]
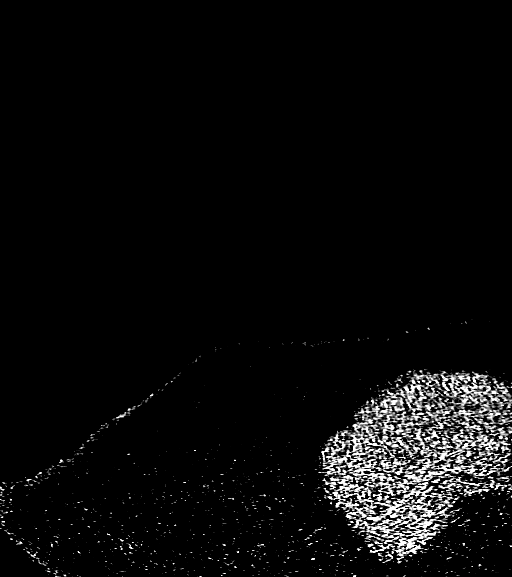

[Series 10: sagittals · sagittal · 0.33mm/px · 2 of 72 slices shown]
[im 24/72  brain]
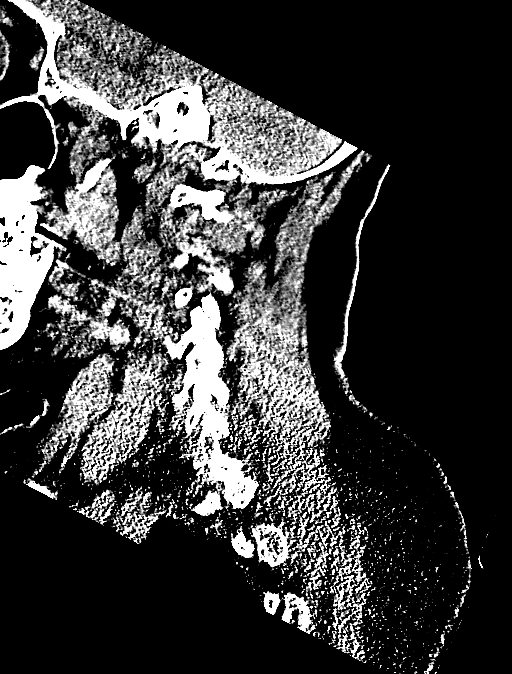
[im 48/72  brain]
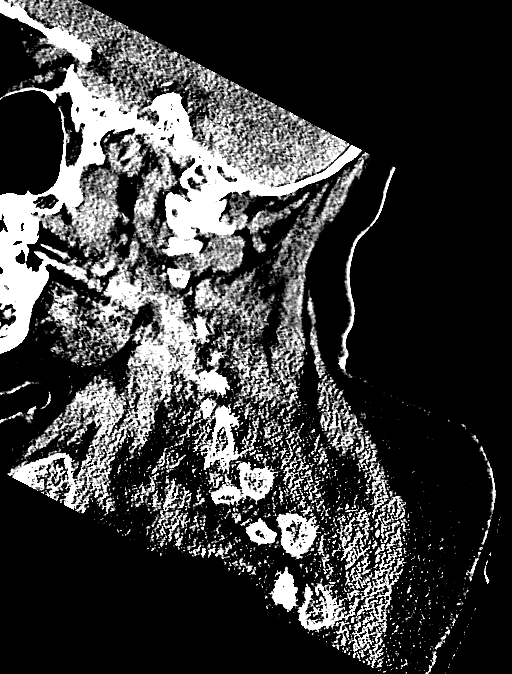

[Series 11: orthogonals · axial · 0.32mm/px · z∈[-333,-187]mm · 8 of 98 slices shown, 10 images]
[im 7/98  brain]
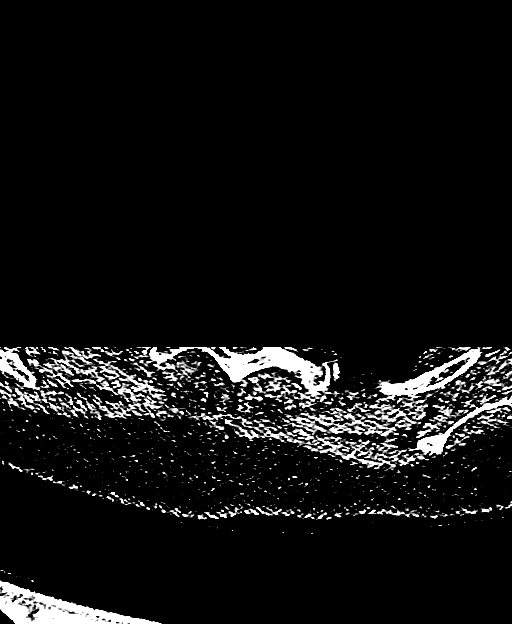
[im 7/98  bone]
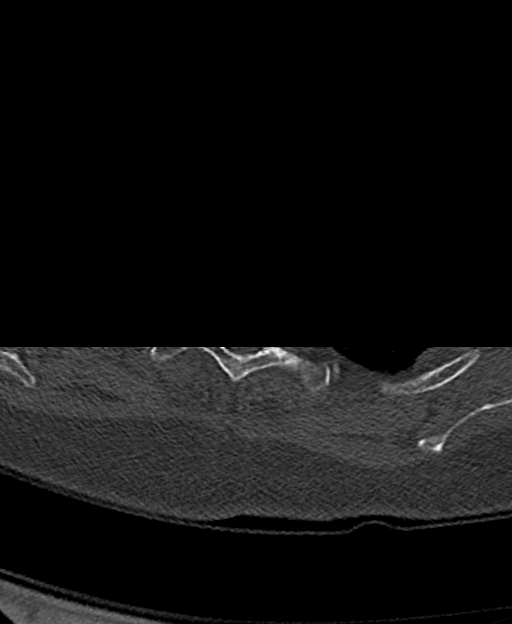
[im 21/98  brain]
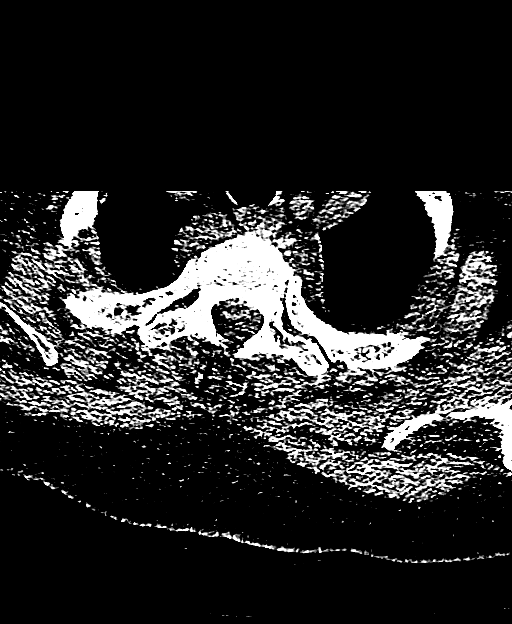
[im 35/98  brain]
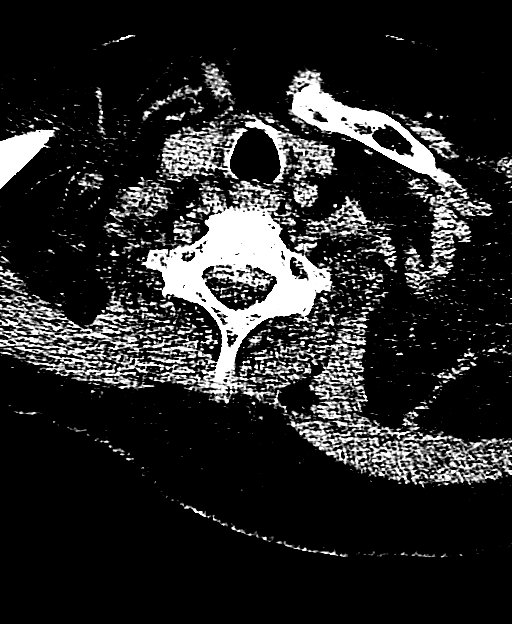
[im 42/98  brain]
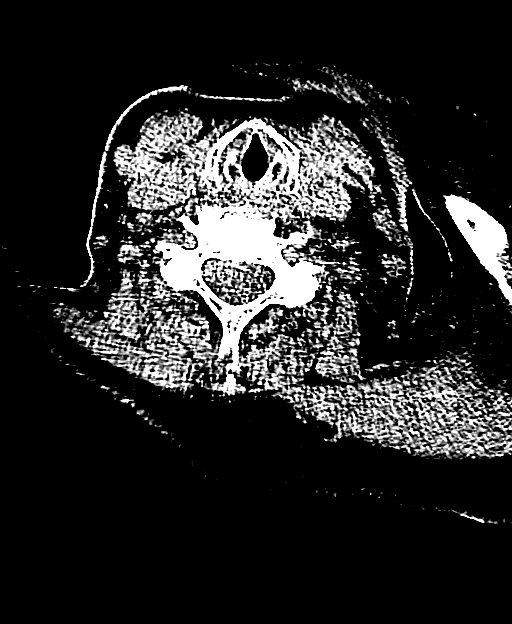
[im 56/98  brain]
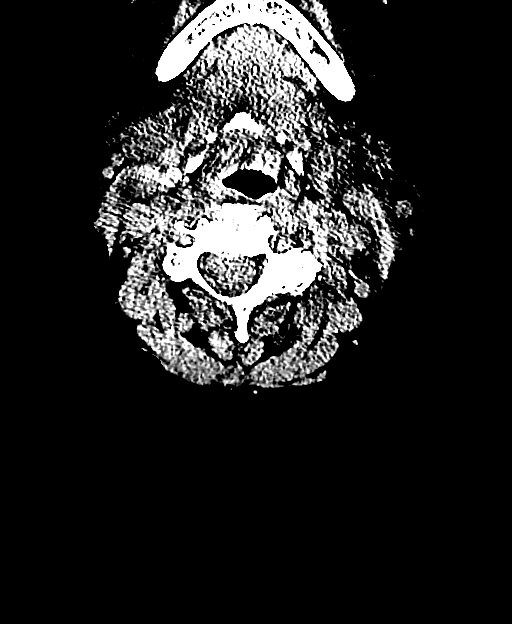
[im 56/98  bone]
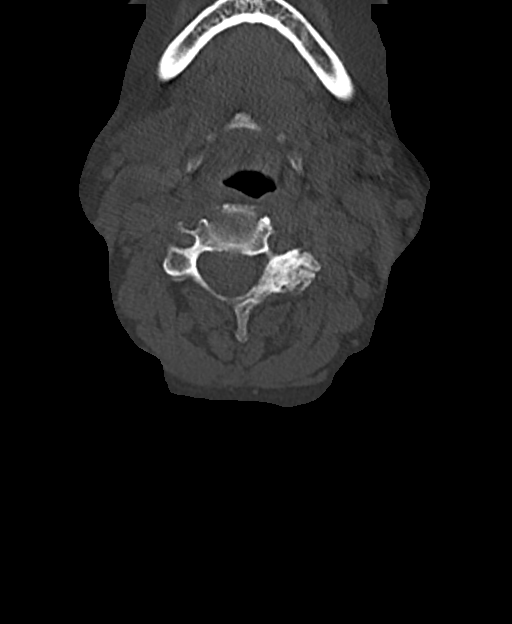
[im 63/98  brain]
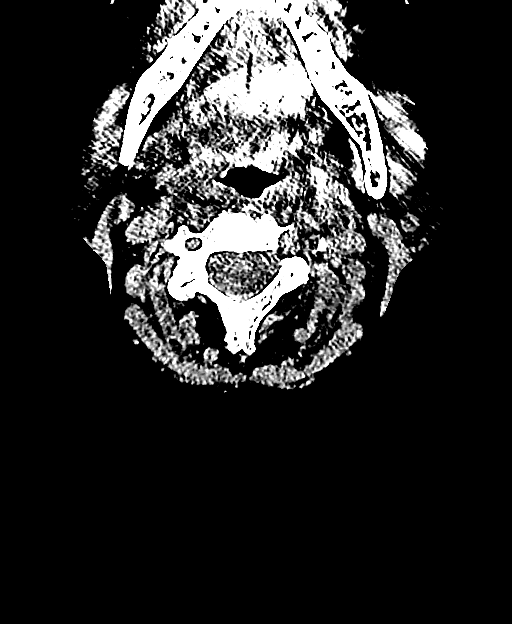
[im 77/98  brain]
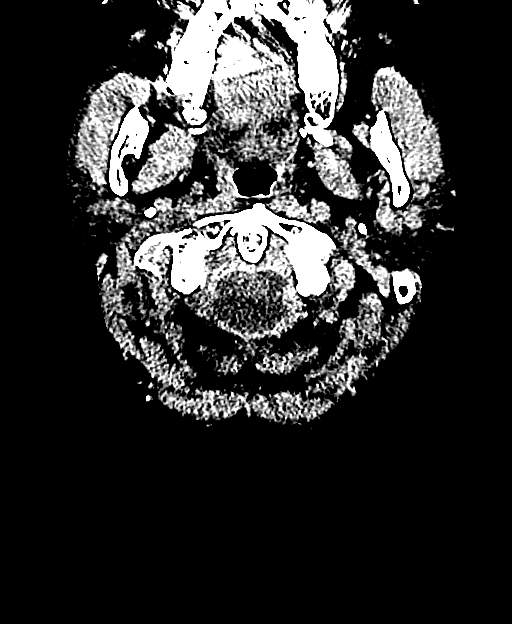
[im 91/98  brain]
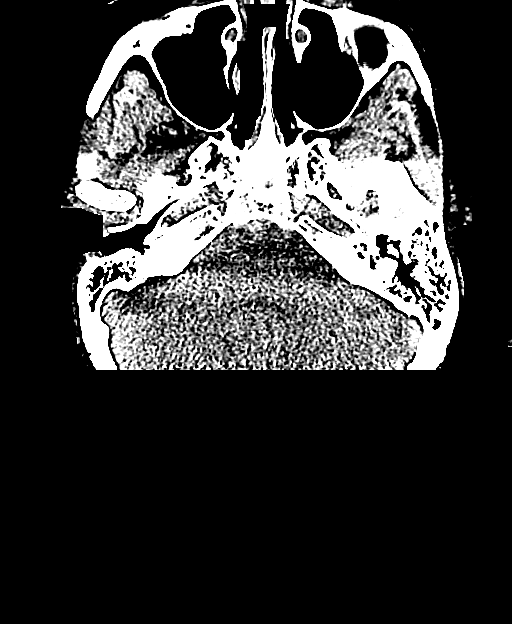

[13 of 47 positions shown; findings below may reference images not displayed]

FINDINGS: CT HEAD FINDINGS

Brain: No evidence of acute infarction, hemorrhage, extra-axial
collection, ventriculomegaly, or mass effect. Generalized cerebral
atrophy. Periventricular white matter low attenuation likely
secondary to microangiopathy.

Vascular: Cerebrovascular atherosclerotic calcifications are noted.

Skull: Negative for fracture or focal lesion.

Sinuses/Orbits: Visualized portions of the orbits are unremarkable.
Mild mucosal thickening in the left sphenoid sinus.

Other: None.

CT CERVICAL SPINE FINDINGS

Alignment: Normal.

Skull base and vertebrae: Acute compression fracture with
approximately 60% height loss of the T2 vertebral body with 3 mm of
retropulsion of the posterior inferior margin of the T2 vertebral
body. No primary bone lesion or focal pathologic process.

Soft tissues and spinal canal: No prevertebral fluid or swelling. No
visible canal hematoma.

Disc levels: Degenerative disc disease with disc height loss at
C3-4. Severe degenerative disc disease with disc height loss at
C4-5, C5-6, C6-7. Severe left facet arthropathy at C3-4. At C4-5
there is moderate left and mild right facet arthropathy with
bilateral foraminal narrowing. At C5-6 there is a broad-based disc
osteophyte complex and moderate bilateral facet arthropathy with
bilateral foraminal stenosis. At C5-6 there is a broad-based disc
osteophyte complex with bilateral facet arthropathy and bilateral
foraminal stenosis. Bilateral facet arthropathy at C7-T1, T1-2 and
T2-3.

Upper chest: Lung apices are clear.

Other: No fluid collection or hematoma.
IMPRESSION: 1. No acute intracranial pathology.
2. Acute compression fracture with approximately 60% height loss of
the T2 vertebral body with 3 mm of retropulsion of the posterior
inferior margin of the T2 vertebral body.

## 2017-06-28 DIAGNOSIS — L218 Other seborrheic dermatitis: Secondary | ICD-10-CM | POA: Diagnosis not present

## 2017-06-28 DIAGNOSIS — L814 Other melanin hyperpigmentation: Secondary | ICD-10-CM | POA: Diagnosis not present

## 2017-06-28 DIAGNOSIS — D1801 Hemangioma of skin and subcutaneous tissue: Secondary | ICD-10-CM | POA: Diagnosis not present

## 2017-07-12 DIAGNOSIS — H02831 Dermatochalasis of right upper eyelid: Secondary | ICD-10-CM | POA: Diagnosis not present

## 2017-07-12 DIAGNOSIS — H02834 Dermatochalasis of left upper eyelid: Secondary | ICD-10-CM | POA: Diagnosis not present

## 2017-07-12 DIAGNOSIS — H52203 Unspecified astigmatism, bilateral: Secondary | ICD-10-CM | POA: Diagnosis not present

## 2017-07-12 DIAGNOSIS — H02403 Unspecified ptosis of bilateral eyelids: Secondary | ICD-10-CM | POA: Diagnosis not present

## 2017-08-21 DIAGNOSIS — R2681 Unsteadiness on feet: Secondary | ICD-10-CM | POA: Diagnosis not present

## 2017-08-26 DIAGNOSIS — M6281 Muscle weakness (generalized): Secondary | ICD-10-CM | POA: Diagnosis not present

## 2017-08-26 DIAGNOSIS — Z9181 History of falling: Secondary | ICD-10-CM | POA: Diagnosis not present

## 2017-08-26 DIAGNOSIS — R2681 Unsteadiness on feet: Secondary | ICD-10-CM | POA: Diagnosis not present

## 2017-08-29 DIAGNOSIS — M6281 Muscle weakness (generalized): Secondary | ICD-10-CM | POA: Diagnosis not present

## 2017-08-29 DIAGNOSIS — Z9181 History of falling: Secondary | ICD-10-CM | POA: Diagnosis not present

## 2017-08-29 DIAGNOSIS — R2681 Unsteadiness on feet: Secondary | ICD-10-CM | POA: Diagnosis not present

## 2017-08-30 DIAGNOSIS — R2681 Unsteadiness on feet: Secondary | ICD-10-CM | POA: Diagnosis not present

## 2017-08-30 DIAGNOSIS — Z9181 History of falling: Secondary | ICD-10-CM | POA: Diagnosis not present

## 2017-08-30 DIAGNOSIS — M6281 Muscle weakness (generalized): Secondary | ICD-10-CM | POA: Diagnosis not present

## 2017-09-02 DIAGNOSIS — R2681 Unsteadiness on feet: Secondary | ICD-10-CM | POA: Diagnosis not present

## 2017-09-02 DIAGNOSIS — Z9181 History of falling: Secondary | ICD-10-CM | POA: Diagnosis not present

## 2017-09-02 DIAGNOSIS — M6281 Muscle weakness (generalized): Secondary | ICD-10-CM | POA: Diagnosis not present

## 2017-09-03 DIAGNOSIS — R2681 Unsteadiness on feet: Secondary | ICD-10-CM | POA: Diagnosis not present

## 2017-09-03 DIAGNOSIS — H0279 Other degenerative disorders of eyelid and periocular area: Secondary | ICD-10-CM | POA: Diagnosis not present

## 2017-09-03 DIAGNOSIS — H02834 Dermatochalasis of left upper eyelid: Secondary | ICD-10-CM | POA: Diagnosis not present

## 2017-09-03 DIAGNOSIS — H53483 Generalized contraction of visual field, bilateral: Secondary | ICD-10-CM | POA: Diagnosis not present

## 2017-09-03 DIAGNOSIS — H02831 Dermatochalasis of right upper eyelid: Secondary | ICD-10-CM | POA: Diagnosis not present

## 2017-09-03 DIAGNOSIS — H02413 Mechanical ptosis of bilateral eyelids: Secondary | ICD-10-CM | POA: Diagnosis not present

## 2017-09-03 DIAGNOSIS — Z9181 History of falling: Secondary | ICD-10-CM | POA: Diagnosis not present

## 2017-09-03 DIAGNOSIS — M6281 Muscle weakness (generalized): Secondary | ICD-10-CM | POA: Diagnosis not present

## 2017-09-03 DIAGNOSIS — H02423 Myogenic ptosis of bilateral eyelids: Secondary | ICD-10-CM | POA: Diagnosis not present

## 2017-09-06 DIAGNOSIS — M6281 Muscle weakness (generalized): Secondary | ICD-10-CM | POA: Diagnosis not present

## 2017-09-06 DIAGNOSIS — Z9181 History of falling: Secondary | ICD-10-CM | POA: Diagnosis not present

## 2017-09-06 DIAGNOSIS — R2681 Unsteadiness on feet: Secondary | ICD-10-CM | POA: Diagnosis not present

## 2017-09-09 DIAGNOSIS — M6281 Muscle weakness (generalized): Secondary | ICD-10-CM | POA: Diagnosis not present

## 2017-09-09 DIAGNOSIS — Z9181 History of falling: Secondary | ICD-10-CM | POA: Diagnosis not present

## 2017-09-09 DIAGNOSIS — R2681 Unsteadiness on feet: Secondary | ICD-10-CM | POA: Diagnosis not present

## 2017-09-10 DIAGNOSIS — R2681 Unsteadiness on feet: Secondary | ICD-10-CM | POA: Diagnosis not present

## 2017-09-10 DIAGNOSIS — Z9181 History of falling: Secondary | ICD-10-CM | POA: Diagnosis not present

## 2017-09-10 DIAGNOSIS — M6281 Muscle weakness (generalized): Secondary | ICD-10-CM | POA: Diagnosis not present

## 2017-09-11 DIAGNOSIS — E559 Vitamin D deficiency, unspecified: Secondary | ICD-10-CM | POA: Diagnosis not present

## 2017-09-11 DIAGNOSIS — E7800 Pure hypercholesterolemia, unspecified: Secondary | ICD-10-CM | POA: Diagnosis not present

## 2017-09-11 DIAGNOSIS — M8000XA Age-related osteoporosis with current pathological fracture, unspecified site, initial encounter for fracture: Secondary | ICD-10-CM | POA: Diagnosis not present

## 2017-09-11 DIAGNOSIS — F39 Unspecified mood [affective] disorder: Secondary | ICD-10-CM | POA: Diagnosis not present

## 2017-09-11 DIAGNOSIS — E039 Hypothyroidism, unspecified: Secondary | ICD-10-CM | POA: Diagnosis not present

## 2017-09-11 DIAGNOSIS — Z0000 Encounter for general adult medical examination without abnormal findings: Secondary | ICD-10-CM | POA: Diagnosis not present

## 2017-09-11 DIAGNOSIS — Z1389 Encounter for screening for other disorder: Secondary | ICD-10-CM | POA: Diagnosis not present

## 2017-09-13 DIAGNOSIS — Z9181 History of falling: Secondary | ICD-10-CM | POA: Diagnosis not present

## 2017-09-13 DIAGNOSIS — M6281 Muscle weakness (generalized): Secondary | ICD-10-CM | POA: Diagnosis not present

## 2017-09-13 DIAGNOSIS — R2681 Unsteadiness on feet: Secondary | ICD-10-CM | POA: Diagnosis not present

## 2017-09-16 DIAGNOSIS — R2681 Unsteadiness on feet: Secondary | ICD-10-CM | POA: Diagnosis not present

## 2017-09-16 DIAGNOSIS — M6281 Muscle weakness (generalized): Secondary | ICD-10-CM | POA: Diagnosis not present

## 2017-09-16 DIAGNOSIS — Z9181 History of falling: Secondary | ICD-10-CM | POA: Diagnosis not present

## 2017-09-17 DIAGNOSIS — M6281 Muscle weakness (generalized): Secondary | ICD-10-CM | POA: Diagnosis not present

## 2017-09-17 DIAGNOSIS — R2681 Unsteadiness on feet: Secondary | ICD-10-CM | POA: Diagnosis not present

## 2017-09-17 DIAGNOSIS — Z9181 History of falling: Secondary | ICD-10-CM | POA: Diagnosis not present

## 2017-09-19 DIAGNOSIS — Z9181 History of falling: Secondary | ICD-10-CM | POA: Diagnosis not present

## 2017-09-19 DIAGNOSIS — R2681 Unsteadiness on feet: Secondary | ICD-10-CM | POA: Diagnosis not present

## 2017-09-19 DIAGNOSIS — M6281 Muscle weakness (generalized): Secondary | ICD-10-CM | POA: Diagnosis not present

## 2017-09-24 DIAGNOSIS — M79671 Pain in right foot: Secondary | ICD-10-CM | POA: Diagnosis not present

## 2017-09-24 DIAGNOSIS — M159 Polyosteoarthritis, unspecified: Secondary | ICD-10-CM | POA: Diagnosis not present

## 2017-10-28 DIAGNOSIS — M8588 Other specified disorders of bone density and structure, other site: Secondary | ICD-10-CM | POA: Diagnosis not present

## 2017-10-28 DIAGNOSIS — Z79899 Other long term (current) drug therapy: Secondary | ICD-10-CM | POA: Diagnosis not present

## 2017-10-28 DIAGNOSIS — M810 Age-related osteoporosis without current pathological fracture: Secondary | ICD-10-CM | POA: Diagnosis not present

## 2017-10-30 DIAGNOSIS — Z1231 Encounter for screening mammogram for malignant neoplasm of breast: Secondary | ICD-10-CM | POA: Diagnosis not present

## 2017-11-25 DIAGNOSIS — H02834 Dermatochalasis of left upper eyelid: Secondary | ICD-10-CM | POA: Diagnosis not present

## 2017-11-25 DIAGNOSIS — H53483 Generalized contraction of visual field, bilateral: Secondary | ICD-10-CM | POA: Diagnosis not present

## 2017-11-25 DIAGNOSIS — H02831 Dermatochalasis of right upper eyelid: Secondary | ICD-10-CM | POA: Diagnosis not present

## 2017-11-25 DIAGNOSIS — H02423 Myogenic ptosis of bilateral eyelids: Secondary | ICD-10-CM | POA: Diagnosis not present

## 2017-11-25 DIAGNOSIS — H02413 Mechanical ptosis of bilateral eyelids: Secondary | ICD-10-CM | POA: Diagnosis not present

## 2017-11-25 DIAGNOSIS — H0279 Other degenerative disorders of eyelid and periocular area: Secondary | ICD-10-CM | POA: Diagnosis not present

## 2018-03-17 DIAGNOSIS — M8000XA Age-related osteoporosis with current pathological fracture, unspecified site, initial encounter for fracture: Secondary | ICD-10-CM | POA: Diagnosis not present

## 2018-03-17 DIAGNOSIS — Z23 Encounter for immunization: Secondary | ICD-10-CM | POA: Diagnosis not present

## 2018-05-21 ENCOUNTER — Emergency Department (HOSPITAL_BASED_OUTPATIENT_CLINIC_OR_DEPARTMENT_OTHER)
Admission: EM | Admit: 2018-05-21 | Discharge: 2018-05-21 | Disposition: A | Discharge status: Home / Self Care | Payer: Medicare Other | Attending: Emergency Medicine | Admitting: Emergency Medicine

## 2018-05-21 ENCOUNTER — Other Ambulatory Visit: Payer: Self-pay

## 2018-05-21 ENCOUNTER — Emergency Department (HOSPITAL_BASED_OUTPATIENT_CLINIC_OR_DEPARTMENT_OTHER): Payer: Medicare Other

## 2018-05-21 ENCOUNTER — Encounter (HOSPITAL_BASED_OUTPATIENT_CLINIC_OR_DEPARTMENT_OTHER): Payer: Self-pay | Admitting: *Deleted

## 2018-05-21 DIAGNOSIS — S0101XA Laceration without foreign body of scalp, initial encounter: Secondary | ICD-10-CM

## 2018-05-21 DIAGNOSIS — Z96659 Presence of unspecified artificial knee joint: Secondary | ICD-10-CM | POA: Insufficient documentation

## 2018-05-21 DIAGNOSIS — Y929 Unspecified place or not applicable: Secondary | ICD-10-CM | POA: Diagnosis not present

## 2018-05-21 DIAGNOSIS — Y999 Unspecified external cause status: Secondary | ICD-10-CM | POA: Insufficient documentation

## 2018-05-21 DIAGNOSIS — W19XXXA Unspecified fall, initial encounter: Secondary | ICD-10-CM

## 2018-05-21 DIAGNOSIS — E039 Hypothyroidism, unspecified: Secondary | ICD-10-CM | POA: Insufficient documentation

## 2018-05-21 DIAGNOSIS — I10 Essential (primary) hypertension: Secondary | ICD-10-CM | POA: Diagnosis not present

## 2018-05-21 DIAGNOSIS — Z79899 Other long term (current) drug therapy: Secondary | ICD-10-CM | POA: Diagnosis not present

## 2018-05-21 DIAGNOSIS — W0110XA Fall on same level from slipping, tripping and stumbling with subsequent striking against unspecified object, initial encounter: Secondary | ICD-10-CM | POA: Diagnosis not present

## 2018-05-21 DIAGNOSIS — Y939 Activity, unspecified: Secondary | ICD-10-CM | POA: Diagnosis not present

## 2018-05-21 DIAGNOSIS — S0990XA Unspecified injury of head, initial encounter: Secondary | ICD-10-CM | POA: Diagnosis not present

## 2018-05-21 MED ORDER — LIDOCAINE HCL 2 % IJ SOLN
5.0000 mL | Freq: Once | INTRAMUSCULAR | Status: AC
  Administered 2018-05-21: 100 mg
  Filled 2018-05-21: qty 20

## 2018-05-21 NOTE — Discharge Instructions (Addendum)
Have the staples taken out in around 10 days.

## 2018-05-21 NOTE — ED Triage Notes (Signed)
Pt reports slip and fall in dining room hitting her head on the wall just pta. Pt is a/a/ox4, small lac noted to top of head, tata rn cleansing wound during triage. Pt denies any pain or c/o.

## 2018-05-21 NOTE — ED Provider Notes (Signed)
[place] EMERGENCY DEPARTMENT Provider Note   CSN: [id number] Arrival date & time: 05/21/18  1779     History   Chief Complaint Chief Complaint  Patient presents with  . Fall    HPI Sarah Fletcher is a 84 y.o. female.  HPI Patient presents with a fall.  States she tripped over her shoes or sitting on the floor and hit her head.  No loss conscious.  Not on anticoagulation.  No neck pain.  States that she has had falls with this before.  Has laceration of the top of her head.  No numbness or weakness.  No confusion.  No extremity injury. Past Medical History:  Diagnosis Date  . Arthritis   . Hypothyroid   . Osteopenia     There are no active problems to display for this patient.   Past Surgical History:  Procedure Laterality Date  . ABDOMINAL HYSTERECTOMY    . OTHER SURGICAL HISTORY     multiple ortho injuries fx   . REPLACEMENT TOTAL KNEE    . TONSILLECTOMY       OB History   None      Home Medications    Prior to Admission medications   Medication Sig Start Date End Date Taking? Authorizing Provider  denosumab (PROLIA) 60 MG/ML SOLN injection Inject 60 mg into the skin every 6 (six) months. Administer in upper arm, thigh, or abdomen    [provider]  FLUoxetine (PROZAC) 10 MG tablet Take 10 mg by mouth daily.    [provider]  Lactobacillus Rhamnosus, GG, (CULTURELLE) CAPS Take 1 capsule by mouth 2 times daily at 12 noon and 4 pm. 07/20/14   Palumbo, April, MD  levothyroxine (SYNTHROID, LEVOTHROID) 50 MCG tablet Take 50 mcg by mouth daily before breakfast.    [provider]  meloxicam (MOBIC) 7.5 MG tablet Take 1 tablet (7.5 mg total) by mouth daily. 07/20/14   Palumbo, April, MD  mupirocin cream (BACTROBAN) 2 % Apply 1 application topically 2 (two) times daily. 04/03/16   Duffy Bruce, MD  ondansetron (ZOFRAN) 4 MG tablet Take 1 tablet (4 mg total) by mouth every 8 (eight) hours as needed for nausea or vomiting.  04/03/16   Duffy Bruce, MD  oxyCODONE (ROXICODONE) 5 MG/5ML solution Take 2.5 mLs (2.5 mg total) by mouth every 6 (six) hours as needed for breakthrough pain. 04/03/16   Duffy Bruce, MD  phenazopyridine (PYRIDIUM) 200 MG tablet Take 1 tablet (200 mg total) by mouth 3 (three) times daily. 07/19/14   Palumbo, April, MD    Family History History reviewed. No pertinent family history.  Social History Social History   Tobacco Use  . Smoking status: Never Smoker  . Smokeless tobacco: Never Used  Substance Use Topics  . Alcohol use: Yes    Comment: occasional   . Drug use: Not on file     Allergies   Percocet [oxycodone-acetaminophen]   Review of Systems Review of Systems  Constitutional: Negative for appetite change.  HENT: Negative for trouble swallowing.   Respiratory: Negative for shortness of breath.   Cardiovascular: Negative for chest pain.  Gastrointestinal: Negative for abdominal pain.  Endocrine: Negative for polyuria.  Genitourinary: Negative for flank pain.  Musculoskeletal: Negative for back pain.  Skin: Positive for wound.  Neurological: Negative for weakness and headaches.  Psychiatric/Behavioral: Negative for confusion.     Physical Exam Updated Vital Signs BP (!) 183/88 (BP Location: Left Arm)   Temp 98.4 F (36.9  C) (Oral)   Resp 18   Ht 5' (1.524 m)   Wt 58.5 kg   SpO2 96%   BMI 25.19 kg/m   Physical Exam  Constitutional: She appears well-developed.  HENT:  Approximately 5 cm laceration on vertex of scalp.  Eyes: Pupils are equal, round, and reactive to light.  Neck: Neck supple.  Cardiovascular: Normal rate.  Pulmonary/Chest: She exhibits no tenderness.  Abdominal: There is no tenderness.  Musculoskeletal: She exhibits no tenderness.  No tenderness over cervical spine or upper or lower extremities.  No lumbar spine tenderness.  Neurological: She is alert.  Skin: Skin is warm. Capillary refill takes less than 2 seconds.     ED  Treatments / Results  Labs (all labs ordered are listed, but only abnormal results are displayed) Labs Reviewed - No data to display  EKG None  Radiology Ct Head Wo Contrast  Result Date: 05/21/2018 CLINICAL DATA:  Head injury after fall.  No loss of consciousness. EXAM: CT HEAD WITHOUT CONTRAST TECHNIQUE: Contiguous axial images were obtained from the base of the skull through the vertex without intravenous contrast. COMPARISON:  CT scan of April 03, 2016. FINDINGS: Brain: No evidence of acute infarction, hemorrhage, hydrocephalus, extra-axial collection or mass lesion/mass effect. Vascular: No hyperdense vessel or unexpected calcification. Skull: Normal. Negative for fracture or focal lesion. Sinuses/Orbits: Left sphenoid sinusitis. Other: None. IMPRESSION: Left sphenoid sinusitis.  No acute intracranial abnormality seen. Electronically Signed   By: Marijo Conception, M.D.   On: 05/21/2018 10:16    Procedures .Marland KitchenLaceration Repair Date/Time: 05/21/2018 2:38 PM Performed by: Davonna Belling, MD Authorized by: Davonna Belling, MD   Consent:    Consent obtained:  Verbal   Consent given by:  Patient   Risks discussed:  Infection, pain, need for additional repair, nerve damage and poor wound healing   Alternatives discussed:  No treatment Anesthesia (see MAR for exact dosages):    Anesthesia method:  Local infiltration   Local anesthetic:  Lidocaine 2% w/o epi Laceration details:    Location:  Scalp   Scalp location:  Crown   Length (cm):  5 Repair type:    Repair type:  Simple Pre-procedure details:    Preparation:  Patient was prepped and draped in usual sterile fashion Treatment:    Area cleansed with:  Soap and water   Amount of cleaning:  Standard Skin repair:    Repair method:  Staples   Number of staples:  6 Approximation:    Approximation:  Close Post-procedure details:    Patient tolerance of procedure:  Tolerated well, no immediate complications   (including  critical care time)  Medications Ordered in ED Medications  lidocaine (XYLOCAINE) 2 % (with pres) injection 100 mg (100 mg Infiltration Given 05/21/18 1018)     Initial Impression / Assessment and Plan / ED Course  I have reviewed the triage vital signs and the nursing notes.  Pertinent labs & imaging results that were available during my care of the patient were reviewed by me and considered in my medical decision making (see chart for details).     Patient with fall.  Laceration to scalp.  Head CT reassuring.  Closed with staples.  Final Clinical Impressions(s) / ED Diagnoses   Final diagnoses:  Fall, initial encounter  Laceration of scalp, initial encounter    ED Discharge Orders    None       Davonna Belling, MD 05/21/18 1439

## 2018-05-30 DIAGNOSIS — Z4802 Encounter for removal of sutures: Secondary | ICD-10-CM | POA: Diagnosis not present

## 2018-07-17 DIAGNOSIS — H6123 Impacted cerumen, bilateral: Secondary | ICD-10-CM | POA: Diagnosis not present

## 2018-07-25 DIAGNOSIS — Z961 Presence of intraocular lens: Secondary | ICD-10-CM | POA: Diagnosis not present

## 2018-07-25 DIAGNOSIS — H52203 Unspecified astigmatism, bilateral: Secondary | ICD-10-CM | POA: Diagnosis not present

## 2018-07-25 DIAGNOSIS — H26493 Other secondary cataract, bilateral: Secondary | ICD-10-CM | POA: Diagnosis not present

## 2018-07-25 DIAGNOSIS — H43813 Vitreous degeneration, bilateral: Secondary | ICD-10-CM | POA: Diagnosis not present

## 2018-08-14 DIAGNOSIS — H903 Sensorineural hearing loss, bilateral: Secondary | ICD-10-CM | POA: Diagnosis not present

## 2018-08-19 DIAGNOSIS — M79672 Pain in left foot: Secondary | ICD-10-CM | POA: Diagnosis not present

## 2018-08-19 DIAGNOSIS — M79671 Pain in right foot: Secondary | ICD-10-CM | POA: Diagnosis not present

## 2018-10-08 DIAGNOSIS — S40812A Abrasion of left upper arm, initial encounter: Secondary | ICD-10-CM | POA: Diagnosis not present

## 2018-10-09 DIAGNOSIS — S40812D Abrasion of left upper arm, subsequent encounter: Secondary | ICD-10-CM | POA: Diagnosis not present

## 2018-10-10 DIAGNOSIS — S40812D Abrasion of left upper arm, subsequent encounter: Secondary | ICD-10-CM | POA: Diagnosis not present

## 2018-10-13 DIAGNOSIS — S40812D Abrasion of left upper arm, subsequent encounter: Secondary | ICD-10-CM | POA: Diagnosis not present

## 2018-10-13 DIAGNOSIS — Z5189 Encounter for other specified aftercare: Secondary | ICD-10-CM | POA: Diagnosis not present

## 2018-10-15 DIAGNOSIS — S40812D Abrasion of left upper arm, subsequent encounter: Secondary | ICD-10-CM | POA: Diagnosis not present

## 2018-10-15 DIAGNOSIS — Z5189 Encounter for other specified aftercare: Secondary | ICD-10-CM | POA: Diagnosis not present

## 2018-10-17 DIAGNOSIS — Z5189 Encounter for other specified aftercare: Secondary | ICD-10-CM | POA: Diagnosis not present

## 2018-10-20 DIAGNOSIS — Z5189 Encounter for other specified aftercare: Secondary | ICD-10-CM | POA: Diagnosis not present

## 2018-10-24 DIAGNOSIS — S51812D Laceration without foreign body of left forearm, subsequent encounter: Secondary | ICD-10-CM | POA: Diagnosis not present

## 2018-10-24 DIAGNOSIS — Z5189 Encounter for other specified aftercare: Secondary | ICD-10-CM | POA: Diagnosis not present

## 2018-12-01 DIAGNOSIS — E7800 Pure hypercholesterolemia, unspecified: Secondary | ICD-10-CM | POA: Diagnosis not present

## 2018-12-01 DIAGNOSIS — Z0000 Encounter for general adult medical examination without abnormal findings: Secondary | ICD-10-CM | POA: Diagnosis not present

## 2018-12-01 DIAGNOSIS — F39 Unspecified mood [affective] disorder: Secondary | ICD-10-CM | POA: Diagnosis not present

## 2018-12-01 DIAGNOSIS — E039 Hypothyroidism, unspecified: Secondary | ICD-10-CM | POA: Diagnosis not present

## 2018-12-01 DIAGNOSIS — M1990 Unspecified osteoarthritis, unspecified site: Secondary | ICD-10-CM | POA: Diagnosis not present

## 2018-12-01 DIAGNOSIS — M8000XA Age-related osteoporosis with current pathological fracture, unspecified site, initial encounter for fracture: Secondary | ICD-10-CM | POA: Diagnosis not present

## 2018-12-01 DIAGNOSIS — E559 Vitamin D deficiency, unspecified: Secondary | ICD-10-CM | POA: Diagnosis not present

## 2018-12-01 DIAGNOSIS — Z1389 Encounter for screening for other disorder: Secondary | ICD-10-CM | POA: Diagnosis not present

## 2018-12-04 DIAGNOSIS — E559 Vitamin D deficiency, unspecified: Secondary | ICD-10-CM | POA: Diagnosis not present

## 2018-12-04 DIAGNOSIS — E039 Hypothyroidism, unspecified: Secondary | ICD-10-CM | POA: Diagnosis not present

## 2018-12-04 DIAGNOSIS — M8000XA Age-related osteoporosis with current pathological fracture, unspecified site, initial encounter for fracture: Secondary | ICD-10-CM | POA: Diagnosis not present

## 2018-12-04 DIAGNOSIS — E7800 Pure hypercholesterolemia, unspecified: Secondary | ICD-10-CM | POA: Diagnosis not present

## 2018-12-06 DIAGNOSIS — R079 Chest pain, unspecified: Secondary | ICD-10-CM | POA: Diagnosis not present

## 2018-12-06 DIAGNOSIS — I10 Essential (primary) hypertension: Secondary | ICD-10-CM | POA: Diagnosis not present

## 2018-12-06 DIAGNOSIS — T17920A Food in respiratory tract, part unspecified causing asphyxiation, initial encounter: Secondary | ICD-10-CM | POA: Diagnosis not present

## 2018-12-06 DIAGNOSIS — R918 Other nonspecific abnormal finding of lung field: Secondary | ICD-10-CM | POA: Diagnosis not present

## 2018-12-06 DIAGNOSIS — R000 Tachycardia, unspecified: Secondary | ICD-10-CM | POA: Diagnosis not present

## 2018-12-06 DIAGNOSIS — R072 Precordial pain: Secondary | ICD-10-CM | POA: Diagnosis not present

## 2018-12-07 DIAGNOSIS — R079 Chest pain, unspecified: Secondary | ICD-10-CM | POA: Diagnosis not present

## 2019-01-21 DIAGNOSIS — R42 Dizziness and giddiness: Secondary | ICD-10-CM | POA: Diagnosis not present

## 2019-01-21 DIAGNOSIS — R0989 Other specified symptoms and signs involving the circulatory and respiratory systems: Secondary | ICD-10-CM | POA: Diagnosis not present

## 2019-01-21 DIAGNOSIS — R55 Syncope and collapse: Secondary | ICD-10-CM | POA: Diagnosis not present

## 2019-01-23 DIAGNOSIS — R030 Elevated blood-pressure reading, without diagnosis of hypertension: Secondary | ICD-10-CM | POA: Diagnosis not present

## 2019-01-23 DIAGNOSIS — R55 Syncope and collapse: Secondary | ICD-10-CM | POA: Diagnosis not present

## 2019-03-05 DIAGNOSIS — M8000XA Age-related osteoporosis with current pathological fracture, unspecified site, initial encounter for fracture: Secondary | ICD-10-CM | POA: Diagnosis not present

## 2019-03-05 DIAGNOSIS — F39 Unspecified mood [affective] disorder: Secondary | ICD-10-CM | POA: Diagnosis not present

## 2019-03-05 DIAGNOSIS — Z23 Encounter for immunization: Secondary | ICD-10-CM | POA: Diagnosis not present

## 2019-03-05 DIAGNOSIS — R030 Elevated blood-pressure reading, without diagnosis of hypertension: Secondary | ICD-10-CM | POA: Diagnosis not present

## 2019-03-05 DIAGNOSIS — R55 Syncope and collapse: Secondary | ICD-10-CM | POA: Diagnosis not present

## 2019-04-20 DIAGNOSIS — Z5189 Encounter for other specified aftercare: Secondary | ICD-10-CM | POA: Diagnosis not present

## 2019-04-22 DIAGNOSIS — R42 Dizziness and giddiness: Secondary | ICD-10-CM | POA: Diagnosis not present

## 2019-04-22 DIAGNOSIS — S0003XA Contusion of scalp, initial encounter: Secondary | ICD-10-CM | POA: Diagnosis not present

## 2019-04-22 DIAGNOSIS — M25511 Pain in right shoulder: Secondary | ICD-10-CM | POA: Diagnosis not present

## 2019-04-24 DIAGNOSIS — Z5189 Encounter for other specified aftercare: Secondary | ICD-10-CM | POA: Diagnosis not present

## 2019-04-28 DIAGNOSIS — Z5189 Encounter for other specified aftercare: Secondary | ICD-10-CM | POA: Diagnosis not present

## 2019-05-01 DIAGNOSIS — M25511 Pain in right shoulder: Secondary | ICD-10-CM | POA: Diagnosis not present

## 2019-06-03 DIAGNOSIS — H04123 Dry eye syndrome of bilateral lacrimal glands: Secondary | ICD-10-CM | POA: Diagnosis not present

## 2019-06-03 DIAGNOSIS — H26493 Other secondary cataract, bilateral: Secondary | ICD-10-CM | POA: Diagnosis not present

## 2019-06-03 DIAGNOSIS — H524 Presbyopia: Secondary | ICD-10-CM | POA: Diagnosis not present

## 2019-06-03 DIAGNOSIS — H52203 Unspecified astigmatism, bilateral: Secondary | ICD-10-CM | POA: Diagnosis not present

## 2019-06-06 ENCOUNTER — Other Ambulatory Visit: Payer: Self-pay

## 2019-06-06 ENCOUNTER — Observation Stay (HOSPITAL_COMMUNITY)
Admission: EM | Admit: 2019-06-06 | Discharge: 2019-06-08 | Disposition: A | Discharge status: Home / Self Care | Payer: Medicare Other | Attending: Internal Medicine | Admitting: Internal Medicine

## 2019-06-06 ENCOUNTER — Emergency Department (HOSPITAL_COMMUNITY): Payer: Medicare Other

## 2019-06-06 DIAGNOSIS — M6281 Muscle weakness (generalized): Secondary | ICD-10-CM | POA: Insufficient documentation

## 2019-06-06 DIAGNOSIS — E038 Other specified hypothyroidism: Secondary | ICD-10-CM

## 2019-06-06 DIAGNOSIS — R410 Disorientation, unspecified: Secondary | ICD-10-CM | POA: Diagnosis present

## 2019-06-06 DIAGNOSIS — E785 Hyperlipidemia, unspecified: Secondary | ICD-10-CM | POA: Insufficient documentation

## 2019-06-06 DIAGNOSIS — G459 Transient cerebral ischemic attack, unspecified: Secondary | ICD-10-CM | POA: Diagnosis not present

## 2019-06-06 DIAGNOSIS — E039 Hypothyroidism, unspecified: Secondary | ICD-10-CM | POA: Diagnosis not present

## 2019-06-06 DIAGNOSIS — Z20828 Contact with and (suspected) exposure to other viral communicable diseases: Secondary | ICD-10-CM | POA: Insufficient documentation

## 2019-06-06 DIAGNOSIS — M8580 Other specified disorders of bone density and structure, unspecified site: Secondary | ICD-10-CM

## 2019-06-06 DIAGNOSIS — Z79899 Other long term (current) drug therapy: Secondary | ICD-10-CM | POA: Diagnosis not present

## 2019-06-06 DIAGNOSIS — R4702 Dysphasia: Secondary | ICD-10-CM | POA: Diagnosis not present

## 2019-06-06 DIAGNOSIS — M1990 Unspecified osteoarthritis, unspecified site: Secondary | ICD-10-CM | POA: Diagnosis present

## 2019-06-06 DIAGNOSIS — R4182 Altered mental status, unspecified: Secondary | ICD-10-CM | POA: Diagnosis present

## 2019-06-06 DIAGNOSIS — R519 Headache, unspecified: Secondary | ICD-10-CM | POA: Insufficient documentation

## 2019-06-06 DIAGNOSIS — F329 Major depressive disorder, single episode, unspecified: Secondary | ICD-10-CM | POA: Diagnosis present

## 2019-06-06 DIAGNOSIS — F32A Depression, unspecified: Secondary | ICD-10-CM | POA: Diagnosis present

## 2019-06-06 LAB — I-STAT CHEM 8, ED
BUN: 19 mg/dL (ref 8–23)
Calcium, Ion: 1.11 mmol/L — ABNORMAL LOW (ref 1.15–1.40)
Chloride: 102 mmol/L (ref 98–111)
Creatinine, Ser: 0.9 mg/dL (ref 0.44–1.00)
Glucose, Bld: 113 mg/dL — ABNORMAL HIGH (ref 70–99)
HCT: 45 % (ref 36.0–46.0)
Hemoglobin: 15.3 g/dL — ABNORMAL HIGH (ref 12.0–15.0)
Potassium: 3.7 mmol/L (ref 3.5–5.1)
Sodium: 135 mmol/L (ref 135–145)
TCO2: 24 mmol/L (ref 22–32)

## 2019-06-06 LAB — CBC
HCT: 44.6 % (ref 36.0–46.0)
Hemoglobin: 14.5 g/dL (ref 12.0–15.0)
MCH: 31 pg (ref 26.0–34.0)
MCHC: 32.5 g/dL (ref 30.0–36.0)
MCV: 95.3 fL (ref 80.0–100.0)
Platelets: 349 10*3/uL (ref 150–400)
RBC: 4.68 MIL/uL (ref 3.87–5.11)
RDW: 13.8 % (ref 11.5–15.5)
WBC: 9.3 10*3/uL (ref 4.0–10.5)
nRBC: 0 % (ref 0.0–0.2)

## 2019-06-06 LAB — COMPREHENSIVE METABOLIC PANEL
ALT: 23 U/L (ref 0–44)
AST: 31 U/L (ref 15–41)
Albumin: 3.9 g/dL (ref 3.5–5.0)
Alkaline Phosphatase: 47 U/L (ref 38–126)
Anion gap: 13 (ref 5–15)
BUN: 16 mg/dL (ref 8–23)
CO2: 22 mmol/L (ref 22–32)
Calcium: 9.6 mg/dL (ref 8.9–10.3)
Chloride: 101 mmol/L (ref 98–111)
Creatinine, Ser: 1.11 mg/dL — ABNORMAL HIGH (ref 0.44–1.00)
GFR calc Af Amer: 52 mL/min — ABNORMAL LOW (ref >60)
GFR calc non Af Amer: 45 mL/min — ABNORMAL LOW (ref >60)
Glucose, Bld: 120 mg/dL — ABNORMAL HIGH (ref 70–99)
Potassium: 3.8 mmol/L (ref 3.5–5.1)
Sodium: 136 mmol/L (ref 135–145)
Total Bilirubin: 0.7 mg/dL (ref 0.3–1.2)
Total Protein: 6.7 g/dL (ref 6.5–8.1)

## 2019-06-06 LAB — PROTIME-INR
INR: 1 (ref 0.8–1.2)
Prothrombin Time: 13.1 seconds (ref 11.4–15.2)

## 2019-06-06 LAB — DIFFERENTIAL
Abs Immature Granulocytes: 0.04 10*3/uL (ref 0.00–0.07)
Basophils Absolute: 0.1 10*3/uL (ref 0.0–0.1)
Basophils Relative: 1 %
Eosinophils Absolute: 0.3 10*3/uL (ref 0.0–0.5)
Eosinophils Relative: 3 %
Immature Granulocytes: 0 %
Lymphocytes Relative: 16 %
Lymphs Abs: 1.5 10*3/uL (ref 0.7–4.0)
Monocytes Absolute: 0.9 10*3/uL (ref 0.1–1.0)
Monocytes Relative: 10 %
Neutro Abs: 6.5 10*3/uL (ref 1.7–7.7)
Neutrophils Relative %: 70 %

## 2019-06-06 LAB — CBG MONITORING, ED: Glucose-Capillary: 107 mg/dL — ABNORMAL HIGH (ref 70–99)

## 2019-06-06 LAB — APTT: aPTT: 24 seconds (ref 24–36)

## 2019-06-06 MED ORDER — ONDANSETRON HCL 4 MG PO TABS: 4.0000 mg | ORAL_TABLET | Freq: Three times a day (TID) | ORAL | Status: DC | PRN

## 2019-06-06 MED ORDER — MELOXICAM 7.5 MG PO TABS
7.5000 mg | ORAL_TABLET | Freq: Every day | ORAL | Status: DC
  Administered 2019-06-07: 09:00:00 7.5 mg via ORAL
  Filled 2019-06-06: qty 1

## 2019-06-06 MED ORDER — ACETAMINOPHEN 650 MG RE SUPP: 650.0000 mg | RECTAL | Status: DC | PRN

## 2019-06-06 MED ORDER — ASPIRIN 81 MG PO CHEW
81.0000 mg | CHEWABLE_TABLET | Freq: Every day | ORAL | Status: DC
  Administered 2019-06-06 – 2019-06-08 (×3): 81 mg via ORAL
  Filled 2019-06-06 (×3): qty 1

## 2019-06-06 MED ORDER — ACETAMINOPHEN 160 MG/5ML PO SOLN: 650.0000 mg | ORAL | Status: DC | PRN

## 2019-06-06 MED ORDER — LORAZEPAM 2 MG/ML IJ SOLN
1.0000 mg | Freq: Once | INTRAMUSCULAR | Status: AC | PRN
  Administered 2019-06-06: 1 mg via INTRAVENOUS
  Filled 2019-06-06: qty 1

## 2019-06-06 MED ORDER — ATORVASTATIN CALCIUM 10 MG PO TABS
20.0000 mg | ORAL_TABLET | Freq: Every day | ORAL | Status: DC
  Administered 2019-06-06: 20 mg via ORAL
  Filled 2019-06-06: qty 2

## 2019-06-06 MED ORDER — SENNOSIDES-DOCUSATE SODIUM 8.6-50 MG PO TABS
1.0000 | ORAL_TABLET | Freq: Every day | ORAL | Status: DC
  Administered 2019-06-06 – 2019-06-07 (×2): 1 via ORAL
  Filled 2019-06-06 (×2): qty 1

## 2019-06-06 MED ORDER — FLUOXETINE HCL 10 MG PO CAPS
10.0000 mg | ORAL_CAPSULE | Freq: Every day | ORAL | Status: DC
  Administered 2019-06-07 – 2019-06-08 (×2): 10 mg via ORAL
  Filled 2019-06-06 (×4): qty 1

## 2019-06-06 MED ORDER — SODIUM CHLORIDE 0.9 % IV SOLN
INTRAVENOUS | Status: DC
  Administered 2019-06-07: 05:00:00 via INTRAVENOUS

## 2019-06-06 MED ORDER — SODIUM CHLORIDE 0.9% FLUSH: 3.0000 mL | Freq: Once | INTRAVENOUS | Status: DC

## 2019-06-06 MED ORDER — ACETAMINOPHEN 325 MG PO TABS: 650.0000 mg | ORAL_TABLET | ORAL | Status: DC | PRN

## 2019-06-06 MED ORDER — LEVOTHYROXINE SODIUM 50 MCG PO TABS
50.0000 ug | ORAL_TABLET | Freq: Every day | ORAL | Status: DC
  Administered 2019-06-07 – 2019-06-08 (×2): 50 ug via ORAL
  Filled 2019-06-06 (×2): qty 1

## 2019-06-06 MED ORDER — SACCHAROMYCES BOULARDII 250 MG PO CAPS
250.0000 mg | ORAL_CAPSULE | Freq: Two times a day (BID) | ORAL | Status: DC
  Administered 2019-06-07 (×2): 250 mg via ORAL
  Filled 2019-06-06 (×2): qty 1

## 2019-06-06 MED ORDER — STROKE: EARLY STAGES OF RECOVERY BOOK
Freq: Once | Status: AC
  Administered 2019-06-06: 23:00:00
  Filled 2019-06-06: qty 1

## 2019-06-06 MED ORDER — OXYCODONE HCL 5 MG/5ML PO SOLN: 2.5000 mg | Freq: Four times a day (QID) | ORAL | Status: DC | PRN

## 2019-06-06 NOTE — H&P (Signed)
History and Physical  [name] [id number] DOB: Jan 17, 1930 DOA: 06/06/2019  Referring physician:  Varney Biles, MD PCP: Kelton Pillar, MD  Outpatient Specialists:  Patient coming from: [place] & is able to ambulate   Chief Complaint: Slurring of speech and visual loss on the right side  HPI: Sarah Fletcher is a 85 y.o. female with medical history significant for hypothyroidism, denies history of stroke or coronary artery disease or hypertension.  She presented to the emergency department with few hours of sudden right side loss of vision transiently as well as slurring of speech.  Patient stated she was on the phone with her daughter about 1:45 PM.  She stated they were on the phone for about 20 minutes but then she noted that  suddenly her speech changed so she advised her to pull her help cord on help right at the assisted facility.  When the nurse in the assisted facility came they noted that her speech was slurred and it was getting worse so they decided to bring her to the hospital patient stated she had some headache been but that went away.  By the time patient got to the emergency room her symptoms have all resolved.  But currently she tells me that she has some headaches and she is looking at the TV now she seems not to be seeing it as well as she used to but reminded had this is in the hospital with a smaller TV and probably further away from what she has at the facility where she lives.  Patient stated that about June this year she was hiking and note felt faint and bad and her blood pressure was elevated but she does not have a history of that patient was evaluated by the neurologist in the ED and recommended for admission for stroke work-up   ED Course: In the ED patient's symptoms completely resolved she underwent CT of the head which was normal.  Neurology saw patient in the ED and noted she is not a candidate for TPA.  Review of  Systems: Patient seen speech difficulty but that has resolved vision change, headache, activity change, Patient denies.  Chest pain nausea vomiting dizziness or lightheadedness or numbness        Past Medical History:  Diagnosis Date  . Arthritis   . Hypothyroid   . Osteopenia    Past Surgical History:  Procedure Laterality Date  . ABDOMINAL HYSTERECTOMY    . OTHER SURGICAL HISTORY     multiple ortho injuries fx   . REPLACEMENT TOTAL KNEE    . TONSILLECTOMY      Social History:  reports that she has never smoked. She has never used smokeless tobacco. She reports current alcohol use. No history on file for drug.   Allergies  Allergen Reactions  . Percocet [Oxycodone-Acetaminophen] Nausea Only    No family history on file.    Prior to Admission medications   Medication Sig Start Date End Date Taking? Authorizing Provider  denosumab (PROLIA) 60 MG/ML SOLN injection Inject 60 mg into the skin every 6 (six) months. Administer in upper arm, thigh, or abdomen    [provider]  FLUoxetine (PROZAC) 10 MG tablet Take 10 mg by mouth daily.    [provider]  Lactobacillus Rhamnosus, GG, (CULTURELLE) CAPS Take 1 capsule by mouth 2 times daily at 12 noon and 4 pm. 07/20/14   Palumbo, April, MD  levothyroxine (SYNTHROID, LEVOTHROID) 50 MCG tablet Take 50  mcg by mouth daily before breakfast.    [provider]  meloxicam (MOBIC) 7.5 MG tablet Take 1 tablet (7.5 mg total) by mouth daily. 07/20/14   Palumbo, April, MD  mupirocin cream (BACTROBAN) 2 % Apply 1 application topically 2 (two) times daily. 04/03/16   Duffy Bruce, MD  ondansetron (ZOFRAN) 4 MG tablet Take 1 tablet (4 mg total) by mouth every 8 (eight) hours as needed for nausea or vomiting. 04/03/16   Duffy Bruce, MD  oxyCODONE (ROXICODONE) 5 MG/5ML solution Take 2.5 mLs (2.5 mg total) by mouth every 6 (six) hours as needed for breakthrough pain. 04/03/16   Duffy Bruce, MD   phenazopyridine (PYRIDIUM) 200 MG tablet Take 1 tablet (200 mg total) by mouth 3 (three) times daily. 07/19/14   Palumbo, April, MD    Physical Exam: BP (!) 166/90   Pulse 89   Temp 98.4 F (36.9 C) (Oral)   Resp 17   Wt 64.5 kg   SpO2 97%   BMI 27.77 kg/m   Exam:  . General: 85 y.o. year-old female well developed well nourished in no acute distress.  Alert and oriented x3. . Cardiovascular: Regular rate and rhythm with no rubs or gallops.  No thyromegaly or JVD noted.   [name] Respiratory: Clear to auscultation with no wheezes or rales. Good inspiratory effort. . Abdomen: Soft nontender nondistended with normal bowel sounds x4 quadrants. . Musculoskeletal: No lower extremity edema. 2/4 pulses in all 4 extremities. . Skin: No ulcerative lesions noted or rashes, . Psychiatry: Mood is appropriate for condition and setting . Neurology exam patient speech is clear power is 5/5 in both upper and lower extremity           Labs on Admission:  Basic Metabolic Panel: Recent Labs  Lab 06/06/19 1525 06/06/19 1528  NA 135 136  K 3.7 3.8  CL 102 101  CO2  --  22  GLUCOSE 113* 120*  BUN 19 16  CREATININE 0.90 1.11*  CALCIUM  --  9.6   Liver Function Tests: Recent Labs  Lab 06/06/19 1528  AST 31  ALT 23  ALKPHOS 47  BILITOT 0.7  PROT 6.7  ALBUMIN 3.9   No results for input(s): LIPASE, AMYLASE in the last 168 hours. No results for input(s): AMMONIA in the last 168 hours. CBC: Recent Labs  Lab 06/06/19 1525 06/06/19 1528  WBC  --  9.3  NEUTROABS  --  6.5  HGB 15.3* 14.5  HCT 45.0 44.6  MCV  --  95.3  PLT  --  349   Cardiac Enzymes: No results for input(s): CKTOTAL, CKMB, CKMBINDEX, TROPONINI in the last 168 hours.  BNP (last 3 results) No results for input(s): BNP in the last 8760 hours.  ProBNP (last 3 results) No results for input(s): PROBNP in the last 8760 hours.  CBG: Recent Labs  Lab 06/06/19 1520  GLUCAP 107*    Radiological Exams on  Admission: CT HEAD CODE STROKE WO CONTRAST  Result Date: 06/06/2019 CLINICAL DATA:  Code stroke.  Left-sided neglect. EXAM: CT HEAD WITHOUT CONTRAST TECHNIQUE: Contiguous axial images were obtained from the base of the skull through the vertex without intravenous contrast. COMPARISON:  CT head 05/21/2018 FINDINGS: Brain: Negative for acute infarct, hemorrhage, mass Cerebral atrophy. Mild changes in the white matter and internal capsule bilaterally appear chronic. Vascular: Negative for hyperdense vessel Skull: Negative Sinuses/Orbits: Air-fluid level sphenoid sinus. Remaining sinuses clear. Bilateral cataract surgery. Other: None ASPECTS [facility] Stroke Program Early CT  Score) - Ganglionic level infarction (caudate, lentiform nuclei, internal capsule, insula, M1-M3 cortex): 7 - Supraganglionic infarction (M4-M6 cortex): 3 Total score (0-10 with 10 being normal): 10 IMPRESSION: 1. No acute intracranial abnormality 2. ASPECTS is 10 3. Atrophy and chronic white matter ischemia 4. Air-fluid level sphenoid sinus. 5. Results texted to Dr. Cheral Marker Electronically Signed   By: Franchot Gallo M.D.   On: 06/06/2019 15:35    EKG: Independently reviewed.  None  Assessment/Plan Present on Admission: . Hypothyroid . TIA (transient ischemic attack) . Depression . Confusion . Arthritis  Active Problems:   Hypothyroid   TIA (transient ischemic attack)   Depression   Confusion   Arthritis   Osteopenia #1 TIA versus small stroke Neurology consulted and recommended the following: 1. HgbA1c, fasting lipid panel 2. MRI, MRA of the brain without contrast 3. PT consult, OT consult, Speech consult 4. Echocardiogram 5. Carotid dopplers 6. Prophylactic therapy- ASA 81 mg po qd. ASA was not on her med list but she states she takes it. Would discuss further with patient tomorrow to determine if she takes regularly, if so she would be classifiable as an ASA failure and switch to Plavix may be considered.  7. Risk  factor modification 8. Telemetry monitoring 9. Frequent neuro checks 10. Modified permissive HTN parameters given advanced age. For the next 24 hours, acutely treat if SBP is > 180   11. Gentle IVF 12. Due to advanced age, benefits of statin most likely outweighed by risks.   2.  Probably early dementia.  3.  Mild hypertension.  Patient denies history of high blood pressure.  Neurology recommended permissive blood pressure of greater than 180 for the next 24 hours  4.  Hypothyroidism continue Synthroid we will check TSH level  5.  Depression patient is on Prozac we will continue  6.  COPD patient is on Prolia 60 mg into the skin every 6 months  7.  Degenerative changes and arthritis patient will continue Mobic  Severity of Illness: The appropriate patient status for this patient is OBSERVATION. Observation status is judged to be reasonable and necessary in order to provide the required intensity of service to ensure the patient's safety. The patient's presenting symptoms, physical exam findings, and initial radiographic and laboratory data in the context of their medical condition is felt to place them at decreased risk for further clinical deterioration. Furthermore, it is anticipated that the patient will be medically stable for discharge from the hospital within 2 midnights of admission. The following factors support the patient status of observation.   " The patient's presenting symptoms include TIA. " The physical exam findings include resolved " The initial radiographic and laboratory data are needs further work-up     DVT prophylaxis: SCD  Code Status: Full  Family Communication: Daughter. Molly at bedside  Disposition Plan: Back to [facility] assisted living  Consults called: Neurology  Admission status: Observation    Cristal Deer MD [facility] Pager [phone]  If 7PM-7AM, please contact night-coverage [email or web address] Password  [facility]  06/06/2019, 6:43 PM

## 2019-06-06 NOTE — Consult Note (Addendum)
Referring Physician: Dr. Kathrynn Humble    Chief Complaint: Acute onset of dysphasia and confusion.  HPI: Sarah Fletcher is an 85 y.o. female who at her assisted living facility today experienced acute onset of speech difficulty, confusion and inability to follow instructions. On arrival by EMS she had some neglect on the left with right sided gaze preference. Subsequently was able to follow commands by EMS and neglect with gaze deviation resolved. No jerking or twitching noted. Also with no weakness. CBG 124. BP 190/110. Patient states she takes ASA and is not on a blood thinner.   LSN: [id number] tPA Given: No: Mild dysphasia is more consistent with confusion than a lesional aphasia. Risks therefore outweigh potential benefits.   Past Medical History:  Diagnosis Date  . Arthritis   . Hypothyroid   . Osteopenia     Past Surgical History:  Procedure Laterality Date  . ABDOMINAL HYSTERECTOMY    . OTHER SURGICAL HISTORY     multiple ortho injuries fx   . REPLACEMENT TOTAL KNEE    . TONSILLECTOMY      No family history on file. Social History:  reports that she has never smoked. She has never used smokeless tobacco. She reports current alcohol use. No history on file for drug.  Allergies:  Allergies  Allergen Reactions  . Percocet [Oxycodone-Acetaminophen] Nausea Only    Home Medications:  No current facility-administered medications on file prior to encounter.   Current Outpatient Medications on File Prior to Encounter  Medication Sig Dispense Refill  . denosumab (PROLIA) 60 MG/ML SOLN injection Inject 60 mg into the skin every 6 (six) months. Administer in upper arm, thigh, or abdomen    . FLUoxetine (PROZAC) 10 MG tablet Take 10 mg by mouth daily.    . Lactobacillus Rhamnosus, GG, (CULTURELLE) CAPS Take 1 capsule by mouth 2 times daily at 12 noon and 4 pm. 60 capsule 0  . levothyroxine (SYNTHROID, LEVOTHROID) 50 MCG tablet Take 50 mcg by mouth daily before breakfast.    . meloxicam  (MOBIC) 7.5 MG tablet Take 1 tablet (7.5 mg total) by mouth daily. 10 tablet 0  . mupirocin cream (BACTROBAN) 2 % Apply 1 application topically 2 (two) times daily. 15 g 0  . ondansetron (ZOFRAN) 4 MG tablet Take 1 tablet (4 mg total) by mouth every 8 (eight) hours as needed for nausea or vomiting. 20 tablet 0  . oxyCODONE (ROXICODONE) 5 MG/5ML solution Take 2.5 mLs (2.5 mg total) by mouth every 6 (six) hours as needed for breakthrough pain. 60 mL 0  . phenazopyridine (PYRIDIUM) 200 MG tablet Take 1 tablet (200 mg total) by mouth 3 (three) times daily. 6 tablet 0  Also on ASA per patient  ROS: No fever or cough. Does not feel ill. Other symptoms as per HPI. Patient unable to provide a comprehensive/detailed ROS due to confusion.   Physical Examination: There were no vitals taken for this visit.  HEENT: [place]/AT Lungs: Respirations unlabored Ext: No edema  Neurologic Examination: Mental Status: Awake and alert. Decreased attention. Oriented to self, city, state, month and day of week, but not year. Perseverates frequently. Mild confusion. Speech fluent except for one categorical paraphasia ("you took off my feet" when shoes were taken off by examiner). Intact naming for thumb and pinky but not forefinger. No hemineglect noted.  Cranial Nerves: II:  No visual field cut in temporal visual fields to confrontation and threat. Due to decreased attention, unable to obtain a more detailed assessment of visual  fields. PERRL.  III,IV, VI: No ptosis. EOMI without nystagmus.  V,VII: No facial droop. Temp sensation equal.  VIII: hearing intact to voice IX,X: Palate rises symmetrically XI: Symmetric shoulder shrug XII: Midline tongue extension  Motor: RUE 5/5 except 4/5 deltoid.  LUE 5/5 BLE 5/5 Tone normal bilaterally  Sensory: Temp and light touch intact in all 4 extremities. No extinction to DSS.  Deep Tendon Reflexes:  2+ bilateral biceps, brachioradialis and patellae.  Plantars: Mute  bilaterally  Cerebellar: No ataxia with FNF bilaterally  Gait: Deferred  Results for orders placed or performed during the hospital encounter of 06/06/19 (from the past 48 hour(s))  CBG monitoring, ED     Status: Abnormal   Collection Time: 06/06/19  3:20 PM  Result Value Ref Range   Glucose-Capillary 107 (H) 70 - 99 mg/dL   No results found.  Assessment: 85 y.o. female presenting with acute onset of confusion.  1. Not a tPA candidate as her mild dysphasia is more consistent with confusion than a lesional aphasia. Risks therefore outweigh potential benefits. TIA versus small stroke should still be evaluated with stroke workup. May have an underlying dementia.  2. CT head negative for acute abnormality. 3. Stroke Risk Factors - Advanced age  Plan: 1. HgbA1c, fasting lipid panel 2. MRI, MRA of the brain without contrast 3. PT consult, OT consult, Speech consult 4. Echocardiogram 5. Carotid dopplers 6. Prophylactic therapy- ASA 81 mg po qd. ASA was not on her med list but she states she takes it. Would discuss further with patient tomorrow to determine if she takes regularly, if so she would be classifiable as an ASA failure and switch to Plavix may be considered.  7. Risk factor modification 8. Telemetry monitoring 9. Frequent neuro checks 10. Modified permissive HTN parameters given advanced age. For the next 24 hours, acutely treat if SBP is > 180   11. Gentle IVF 12. Due to advanced age, benefits of statin most likely outweighed by risks.    @Electronically  signed: Dr. Kerney Elbe 06/06/2019, 3:26 PM

## 2019-06-06 NOTE — ED Triage Notes (Signed)
BIB EMS from [facility] assisted living. Pt was on the phone with her daughter around 19 and started having trouble speaking. EMS reports pt was confused on arrival, could not identify objects and had right side weakness. Pt had some improvement upon arrival at the ER but was still confused. Confused to situation but oriented to self

## 2019-06-06 NOTE — ED Provider Notes (Signed)
[facility] EMERGENCY DEPARTMENT Provider Note   CSN: [id number] Arrival date & time: 06/06/19  1516     History Chief Complaint  Patient presents with  . Altered Mental Status    Sarah Fletcher is a 85 y.o. female.  HPI    85 year old female comes in a chief complaint of vision loss. Patient has history of hypothyroidism.  She has no history of strokes or CAD.  She reports that she was on the phone with her daughter when suddenly she started having right-sided visual disturbance.  She felt like she could not see properly from her right side and her speech was also slurring with that.  She pulled her life alert and had EMS bring her into the hospital.  Patient reports may be having some headache during the episode although at this time she has no headache.  Her symptoms have resolved.  Past Medical History:  Diagnosis Date  . Arthritis   . Hypothyroid   . Osteopenia     Patient Active Problem List   Diagnosis Date Noted  . Hypothyroid   . TIA (transient ischemic attack)   . Depression   . Confusion   . Arthritis   . Osteopenia     Past Surgical History:  Procedure Laterality Date  . ABDOMINAL HYSTERECTOMY    . OTHER SURGICAL HISTORY     multiple ortho injuries fx   . REPLACEMENT TOTAL KNEE    . TONSILLECTOMY       OB History   No obstetric history on file.     No family history on file.  Social History   Tobacco Use  . Smoking status: Never Smoker  . Smokeless tobacco: Never Used  Substance Use Topics  . Alcohol use: Yes    Comment: occasional   . Drug use: Not on file    Home Medications Prior to Admission medications   Medication Sig Start Date End Date Taking? Authorizing Provider  acetaminophen (TYLENOL) 500 MG tablet Take 1,000 mg by mouth every 6 (six) hours as needed (for headaches).   Yes [provider]  denosumab (PROLIA) 60 MG/ML SOLN injection Inject 60 mg into the skin every 6 (six) months. Administer in  upper arm, thigh, or abdomen   Yes [provider]  FLUoxetine (PROZAC) 10 MG tablet Take 10 mg by mouth daily.   Yes [provider]  Lactobacillus Rhamnosus, GG, (CULTURELLE) CAPS Take 1 capsule by mouth 2 times daily at 12 noon and 4 pm. Patient taking differently: Take 1 capsule by mouth daily after breakfast.  07/20/14  Yes Palumbo, April, MD  levothyroxine (SYNTHROID, LEVOTHROID) 50 MCG tablet Take 50 mcg by mouth daily before breakfast.   Yes [provider]  meloxicam (MOBIC) 7.5 MG tablet Take 1 tablet (7.5 mg total) by mouth daily. Patient taking differently: Take 7.5 mg by mouth 2 (two) times daily.  07/20/14  Yes Palumbo, April, MD  psyllium (METAMUCIL) 58.6 % packet Take 1 packet by mouth at bedtime.   Yes [provider]  vitamin C (ASCORBIC ACID) 500 MG tablet Take 500-1,000 mg by mouth daily.   Yes [provider]  mupirocin cream (BACTROBAN) 2 % Apply 1 application topically 2 (two) times daily. Patient not taking: Reported on 06/06/2019 04/03/16   Duffy Bruce, MD  ondansetron (ZOFRAN) 4 MG tablet Take 1 tablet (4 mg total) by mouth every 8 (eight) hours as needed for nausea or vomiting. Patient not taking: Reported on 06/06/2019 04/03/16  Duffy Bruce, MD  oxyCODONE (ROXICODONE) 5 MG/5ML solution Take 2.5 mLs (2.5 mg total) by mouth every 6 (six) hours as needed for breakthrough pain. Patient not taking: Reported on 06/06/2019 04/03/16   Duffy Bruce, MD  phenazopyridine (PYRIDIUM) 200 MG tablet Take 1 tablet (200 mg total) by mouth 3 (three) times daily. Patient not taking: Reported on 06/06/2019 07/19/14   Randal Buba, April, MD    Allergies    Anesthetics, ester; Codeine; Hydrocodone-acetaminophen; and Percocet [oxycodone-acetaminophen]  Review of Systems   Review of Systems  Constitutional: Positive for activity change.  Eyes: Positive for visual disturbance.  Cardiovascular: Negative for chest pain.  Gastrointestinal:  Negative for nausea and vomiting.  Neurological: Positive for speech difficulty. Negative for dizziness, weakness, light-headedness and numbness.  All other systems reviewed and are negative.   Physical Exam Updated Vital Signs BP (!) 164/87   Pulse 83   Temp 98.4 F (36.9 C) (Oral)   Resp 17   Wt 64.5 kg   SpO2 96%   BMI 27.77 kg/m   Physical Exam Vitals and nursing note reviewed.  Constitutional:      Appearance: She is well-developed.  HENT:     Head: Normocephalic and atraumatic.  Cardiovascular:     Rate and Rhythm: Normal rate.  Pulmonary:     Effort: Pulmonary effort is normal.  Abdominal:     General: Bowel sounds are normal.  Musculoskeletal:     Cervical back: Normal range of motion and neck supple.  Skin:    General: Skin is warm and dry.  Neurological:     General: No focal deficit present.     Mental Status: She is alert and oriented to person, place, and time.     Cranial Nerves: No cranial nerve deficit.     Sensory: No sensory deficit.     Motor: No weakness.     Coordination: Coordination normal.     ED Results / Procedures / Treatments   Labs (all labs ordered are listed, but only abnormal results are displayed) Labs Reviewed  COMPREHENSIVE METABOLIC PANEL - Abnormal; Notable for the following components:      Result Value   Glucose, Bld 120 (*)    Creatinine, Ser 1.11 (*)    GFR calc non Af Amer 45 (*)    GFR calc Af Amer 52 (*)    All other components within normal limits  I-STAT CHEM 8, ED - Abnormal; Notable for the following components:   Glucose, Bld 113 (*)    Calcium, Ion 1.11 (*)    Hemoglobin 15.3 (*)    All other components within normal limits  CBG MONITORING, ED - Abnormal; Notable for the following components:   Glucose-Capillary 107 (*)    All other components within normal limits  SARS CORONAVIRUS 2 (TAT 6-24 HRS)  PROTIME-INR  APTT  CBC  DIFFERENTIAL  LIPID PANEL  HEMOGLOBIN A1C    EKG None  Radiology CT  HEAD CODE STROKE WO CONTRAST  Result Date: 06/06/2019 CLINICAL DATA:  Code stroke.  Left-sided neglect. EXAM: CT HEAD WITHOUT CONTRAST TECHNIQUE: Contiguous axial images were obtained from the base of the skull through the vertex without intravenous contrast. COMPARISON:  CT head 05/21/2018 FINDINGS: Brain: Negative for acute infarct, hemorrhage, mass Cerebral atrophy. Mild changes in the white matter and internal capsule bilaterally appear chronic. Vascular: Negative for hyperdense vessel Skull: Negative Sinuses/Orbits: Air-fluid level sphenoid sinus. Remaining sinuses clear. Bilateral cataract surgery. Other: None ASPECTS [facility] Stroke Program Early CT Score) -  Ganglionic level infarction (caudate, lentiform nuclei, internal capsule, insula, M1-M3 cortex): 7 - Supraganglionic infarction (M4-M6 cortex): 3 Total score (0-10 with 10 being normal): 10 IMPRESSION: 1. No acute intracranial abnormality 2. ASPECTS is 10 3. Atrophy and chronic white matter ischemia 4. Air-fluid level sphenoid sinus. 5. Results texted to Dr. Cheral Marker Electronically Signed   By: Franchot Gallo M.D.   On: 06/06/2019 15:35    Procedures Procedures (including critical care time)  Medications Ordered in ED Medications  sodium chloride flush (NS) 0.9 % injection 3 mL (3 mLs Intravenous Not Given 06/06/19 1656)  aspirin chewable tablet 81 mg (81 mg Oral Given 06/06/19 2003)  atorvastatin (LIPITOR) tablet 20 mg (20 mg Oral Given 06/06/19 2004)  LORazepam (ATIVAN) injection 1 mg (1 mg Intravenous Given 06/06/19 2003)    ED Course  I have reviewed the triage vital signs and the nursing notes.  Pertinent labs & imaging results that were available during my care of the patient were reviewed by me and considered in my medical decision making (see chart for details).    MDM Rules/Calculators/A&P                       85 year old comes in a chief complaint of right-sided visual deficits and slurred speech that have since  resolved.  It appears that she told her daughter, with whom she was on the phone when her symptoms started, that she felt different over her left side as well -however patient is not able to expound on that in the ED.  Our neurologic exam is completely benign there is no focal deficits at this time.  Neurology has seen the patient and have recommended that she be admitted to the hospital for TIA-stroke work-up.  Family made aware of the plan.  Final Clinical Impression(s) / ED Diagnoses Final diagnoses:  TIA (transient ischemic attack)    Rx / DC Orders ED Discharge Orders    None       Varney Biles, MD 06/06/19 2109

## 2019-06-07 ENCOUNTER — Encounter (HOSPITAL_COMMUNITY): Payer: Self-pay | Admitting: Family Medicine

## 2019-06-07 ENCOUNTER — Observation Stay (HOSPITAL_COMMUNITY): Payer: Medicare Other

## 2019-06-07 ENCOUNTER — Observation Stay (HOSPITAL_BASED_OUTPATIENT_CLINIC_OR_DEPARTMENT_OTHER): Payer: Medicare Other

## 2019-06-07 DIAGNOSIS — G44209 Tension-type headache, unspecified, not intractable: Secondary | ICD-10-CM | POA: Diagnosis not present

## 2019-06-07 DIAGNOSIS — I361 Nonrheumatic tricuspid (valve) insufficiency: Secondary | ICD-10-CM

## 2019-06-07 DIAGNOSIS — E039 Hypothyroidism, unspecified: Secondary | ICD-10-CM | POA: Diagnosis not present

## 2019-06-07 DIAGNOSIS — E785 Hyperlipidemia, unspecified: Secondary | ICD-10-CM

## 2019-06-07 DIAGNOSIS — G459 Transient cerebral ischemic attack, unspecified: Secondary | ICD-10-CM

## 2019-06-07 LAB — LIPID PANEL
Cholesterol: 271 mg/dL — ABNORMAL HIGH (ref 0–200)
HDL: 106 mg/dL (ref >40)
LDL Cholesterol: 155 mg/dL — ABNORMAL HIGH (ref 0–99)
Total CHOL/HDL Ratio: 2.6 RATIO
Triglycerides: 51 mg/dL (ref <150)
VLDL: 10 mg/dL (ref 0–40)

## 2019-06-07 LAB — C-REACTIVE PROTEIN: CRP: 0.7 mg/dL (ref <1.0)

## 2019-06-07 LAB — SARS CORONAVIRUS 2 (TAT 6-24 HRS): SARS Coronavirus 2: NEGATIVE

## 2019-06-07 LAB — HEMOGLOBIN A1C
Hgb A1c MFr Bld: 6 % — ABNORMAL HIGH (ref 4.8–5.6)
Mean Plasma Glucose: 125.5 mg/dL

## 2019-06-07 LAB — SEDIMENTATION RATE: Sed Rate: 4 mm/hr (ref 0–22)

## 2019-06-07 LAB — ECHOCARDIOGRAM COMPLETE: Weight: 2275.15 oz

## 2019-06-07 MED ORDER — LORAZEPAM 2 MG/ML IJ SOLN
0.5000 mg | Freq: Once | INTRAMUSCULAR | Status: AC
  Administered 2019-06-07: 03:00:00 0.5 mg via INTRAVENOUS
  Filled 2019-06-07: qty 1

## 2019-06-07 MED ORDER — ATORVASTATIN CALCIUM 40 MG PO TABS
40.0000 mg | ORAL_TABLET | Freq: Every day | ORAL | Status: DC
  Administered 2019-06-07: 17:00:00 40 mg via ORAL
  Filled 2019-06-07: qty 1

## 2019-06-07 MED ORDER — MELOXICAM 7.5 MG PO TABS
7.5000 mg | ORAL_TABLET | Freq: Every day | ORAL | Status: DC | PRN
  Filled 2019-06-07: qty 1

## 2019-06-07 MED ORDER — ENOXAPARIN SODIUM 40 MG/0.4ML ~~LOC~~ SOLN
40.0000 mg | SUBCUTANEOUS | Status: DC
  Administered 2019-06-07: 40 mg via SUBCUTANEOUS
  Filled 2019-06-07 (×2): qty 0.4

## 2019-06-07 MED ORDER — CLOPIDOGREL BISULFATE 75 MG PO TABS
75.0000 mg | ORAL_TABLET | Freq: Every day | ORAL | Status: DC
  Administered 2019-06-07 – 2019-06-08 (×2): 75 mg via ORAL
  Filled 2019-06-07 (×2): qty 1

## 2019-06-07 NOTE — Progress Notes (Signed)
VASCULAR LAB PRELIMINARY  PRELIMINARY  PRELIMINARY  PRELIMINARY  Carotid duplex completed.    Preliminary report:  See CV proc for preliminary results.  Leilani Cespedes, RVT 06/07/2019, 5:04 PM

## 2019-06-07 NOTE — Evaluation (Signed)
Physical Therapy Evaluation Patient Details Name: Sarah Fletcher MRN: [id number] DOB: [birth year] Today's Date: 06/07/2019   History of Present Illness  Patient is an 85 year old female admitted with right side vision loss, slurred speech. PMH to include: Hypothyroidism, TIA, OA  Clinical Impression  Patient received in bed, daughter present for evaluation. Patient reports dizziness with bed mobility and required min guard for safety. Continued to report dizziness throughout session, daughter reports this is not new. She transfers sit to stand with min assist and ambulated 50 feet with RW and min guard. Last 10 feet she ambulated with hand held assist. She demonstrates poor balance with activity and will benefit from continued use of AD. She will continue to benefit from skilled PT while here to improve safety, strength and independence with mobility.    Follow Up Recommendations Home health PT    Equipment Recommendations  None recommended by PT    Recommendations for Other Services       Precautions / Restrictions Precautions Precautions: Fall Restrictions Weight Bearing Restrictions: No      Mobility  Bed Mobility Overal bed mobility: Needs Assistance Bed Mobility: Supine to Sit;Sit to Supine     Supine to sit: Min guard Sit to supine: Min guard   General bed mobility comments: increased time required, reports she is dizzy, daughter states this is not new.  Transfers Overall transfer level: Needs assistance Equipment used: None Transfers: Sit to/from Stand Sit to Stand: Min assist         General transfer comment: min assist with sit to stand from low commode, low bed. Unsteady with initial standing balance  Ambulation/Gait Ambulation/Gait assistance: Min guard Gait Distance (Feet): 60 Feet Assistive device: Rolling walker (2 wheeled) Gait Pattern/deviations: Step-through pattern Gait velocity: decreased   General Gait Details: generally unsteady with dynamic  mobility. Benefits from use of RW. Gait distance limited by reported dizziness and the need for her to use bathroom. She was able to ambulate from bathroom back to bed with hand held assist.  Stairs            Wheelchair Mobility    Modified Rankin (Stroke Patients Only)       Balance Overall balance assessment: Needs assistance Sitting-balance support: Feet supported;Bilateral upper extremity supported Sitting balance-Leahy Scale: Fair Sitting balance - Comments: close supervision to min guard needed for sitting at edge of bed due to reported dizziness.   Standing balance support: Bilateral upper extremity supported;During functional activity Standing balance-Leahy Scale: Fair Standing balance comment: reliant on external support                             Pertinent Vitals/Pain Pain Assessment: No/denies pain    Home Living Family/patient expects to be discharged to:: Private residence Living Arrangements: Children Available Help at Discharge: Family Type of Home: House Home Access: Stairs to enter     Home Layout: Two level Home Equipment: Environmental consultant - 2 wheels      Prior Function Level of Independence: Independent               Hand Dominance        Extremity/Trunk Assessment   Upper Extremity Assessment Upper Extremity Assessment: Defer to OT evaluation    Lower Extremity Assessment Lower Extremity Assessment: Generalized weakness    Cervical / Trunk Assessment Cervical / Trunk Assessment: Kyphotic  Communication   Communication: No difficulties  Cognition Arousal/Alertness: Awake/alert Behavior During Therapy:  WFL for tasks assessed/performed Overall Cognitive Status: Within Functional Limits for tasks assessed                                        General Comments      Exercises     Assessment/Plan    PT Assessment Patient needs continued PT services  PT Problem List Decreased strength;Decreased  mobility;Decreased safety awareness;Decreased activity tolerance;Decreased balance;Decreased knowledge of use of DME       PT Treatment Interventions DME instruction;Therapeutic activities;Gait training;Therapeutic exercise;Functional mobility training;Balance training;Patient/family education    PT Goals (Current goals can be found in the Care Plan section)  Acute Rehab PT Goals Patient Stated Goal: to go to daughter's house and then home to her apartment PT Goal Formulation: With patient/family Time For Goal Achievement: 06/10/19 Potential to Achieve Goals: Good    Frequency Min 3X/week   Barriers to discharge Decreased caregiver support she is at independent living in retirement community, but will be going home with daugther for a few days    Co-evaluation PT/OT/SLP Co-Evaluation/Treatment: Yes Reason for Co-Treatment: For patient/therapist safety;To address functional/ADL transfers PT goals addressed during session: Mobility/safety with mobility;Balance;Proper use of DME         AM-PAC PT "6 Clicks" Mobility  Outcome Measure Help needed turning from your back to your side while in a flat bed without using bedrails?: None Help needed moving from lying on your back to sitting on the side of a flat bed without using bedrails?: A Little Help needed moving to and from a bed to a chair (including a wheelchair)?: A Little Help needed standing up from a chair using your arms (e.g., wheelchair or bedside chair)?: A Little Help needed to walk in hospital room?: A Little Help needed climbing 3-5 steps with a railing? : A Lot 6 Click Score: 18    End of Session Equipment Utilized During Treatment: Gait belt Activity Tolerance: Patient tolerated treatment well Patient left: in bed;with bed alarm set;with call bell/phone within reach;with family/visitor present Nurse Communication: Mobility status PT Visit Diagnosis: Unsteadiness on feet (R26.81);Muscle weakness (generalized)  (M62.81);Difficulty in walking, not elsewhere classified (R26.2)    Time: [id number] PT Time Calculation (min) (ACUTE ONLY): 17 min   Charges:   PT Evaluation $PT Eval Moderate Complexity: 1 Mod PT Treatments $Gait Training: 8-22 mins        Sarah Fletcher, PT, GCS 06/07/19,12:51 PM

## 2019-06-07 NOTE — Progress Notes (Signed)
PROGRESS NOTE    Sarah Fletcher  ZOX:[id number] DOB: 05-Oct-1929 DOA: 06/06/2019 PCP: Kelton Pillar, MD  Outpatient Specialists:    Brief Narrative:  Patient is an 85 year old Caucasian female with past medical history significant for osteopenia, hypothyroidism and arthritis.  Patient was admitted with likely TIA.  Patient reported transient episode of blurry vision on the right side.  Collateral information also documented slurred speech.  Patient symptoms have resolved.  Work-up to date is nonrevealing.  Input from neurology, PT OT is appreciated.  Patient will be likely discharge tomorrow with home health PT/OT.  ESR was within normal limits.   Assessment & Plan:   Active Problems:   Hypothyroid   TIA (transient ischemic attack)   Depression   Confusion   Arthritis   Osteopenia  TIA/acute CVA: -Currently on aspirin and Plavix -Symptoms have resolved -MRI is nonrevealing -Echocardiogram is nonrevealing -ESR was 4 -HbA1c is well 6% -Lipid profile revealed LDL of 155 and HDL of 106  Dyslipidemia: Lipitor   Headache: Intermittent Possible tension headache ESR was 4 No history of migraine  DVT prophylaxis: Subcutaneous Lovenox Code Status: Full code Family Communication: Daughter Disposition Plan: Back to the assisted living facility likely tomorrow   Consultants:   Neurology  Procedures:   Echocardiogram  Antimicrobials:   None   Subjective: No complaints No blurry vision No motor symptoms  Objective: Vitals:   06/07/19 0325 06/07/19 0600 06/07/19 0904 06/07/19 1150  BP: (!) 152/83  120/72 114/75  Pulse: 72  71 73  Resp: _0 Temp: 97.9 F (36.6 C)  98.4 F (36.9 C) 98.7 F (37.1 C)  TempSrc: Oral  Oral Oral  SpO2: 97%  97% 96%  Weight:        Intake/Output Summary (Last 24 hours) at 06/07/2019 1511 Last data filed at 06/07/2019 0325 Gross per 24 hour  Intake 120 ml  Output -  Net 120 ml   Filed Weights   06/06/19 1545   Weight: 64.5 kg    Examination:  General exam: Appears calm and comfortable. Respiratory system: Clear to auscultation. Respiratory effort normal. Cardiovascular system: S1 & S2 heard. Gastrointestinal system: Abdomen is nondistended, soft and nontender. No organomegaly or masses felt. Normal bowel sounds heard. Central nervous system: Alert and oriented. No focal neurological deficits. Extremities: No leg edema  Data Reviewed: I have personally reviewed following labs and imaging studies  CBC: Recent Labs  Lab 06/06/19 1525 06/06/19 1528  WBC  --  9.3  NEUTROABS  --  6.5  HGB 15.3* 14.5  HCT 45.0 44.6  MCV  --  95.3  PLT  --  811   Basic Metabolic Panel: Recent Labs  Lab 06/06/19 1525 06/06/19 1528  NA 135 136  K 3.7 3.8  CL 102 101  CO2  --  22  GLUCOSE 113* 120*  BUN 19 16  CREATININE 0.90 1.11*  CALCIUM  --  9.6   GFR: CrCl cannot be calculated (Unknown ideal weight.). Liver Function Tests: Recent Labs  Lab 06/06/19 1528  AST 31  ALT 23  ALKPHOS 47  BILITOT 0.7  PROT 6.7  ALBUMIN 3.9   No results for input(s): LIPASE, AMYLASE in the last 168 hours. No results for input(s): AMMONIA in the last 168 hours. Coagulation Profile: Recent Labs  Lab 06/06/19 1528  INR 1.0   Cardiac Enzymes: No results for input(s): CKTOTAL, CKMB, CKMBINDEX, TROPONINI in the last 168 hours. BNP (last 3 results) No results for input(s):  PROBNP in the last 8760 hours. HbA1C: Recent Labs    06/07/19 0252  HGBA1C 6.0*   CBG: Recent Labs  Lab 06/06/19 1520  GLUCAP 107*   Lipid Profile: Recent Labs    06/07/19 0252  CHOL 271*  HDL 106  LDLCALC 155*  TRIG 51  CHOLHDL 2.6   Thyroid Function Tests: No results for input(s): TSH, T4TOTAL, FREET4, T3FREE, THYROIDAB in the last 72 hours. Anemia Panel: No results for input(s): VITAMINB12, FOLATE, FERRITIN, TIBC, IRON, RETICCTPCT in the last 72 hours. Urine analysis:    Component Value Date/Time   COLORURINE  ORANGE (A) 07/20/2014 0350   APPEARANCEUR CLEAR 07/20/2014 0350   LABSPEC 1.020 07/20/2014 0350   PHURINE 7.0 07/20/2014 0350   GLUCOSEU NEGATIVE 07/20/2014 0350   HGBUR NEGATIVE 07/20/2014 0350   BILIRUBINUR SMALL (A) 07/20/2014 0350   KETONESUR 40 (A) 07/20/2014 0350   PROTEINUR NEGATIVE 07/20/2014 0350   UROBILINOGEN 1.0 07/20/2014 0350   NITRITE POSITIVE (A) 07/20/2014 0350   LEUKOCYTESUR SMALL (A) 07/20/2014 0350   Sepsis Labs: _0 (procalcitonin:4,lacticidven:4)  ) Recent Results (from the past 240 hour(s))  SARS CORONAVIRUS 2 (TAT 6-24 HRS) Nasopharyngeal Nasopharyngeal Swab     Status: None   Collection Time: 06/06/19  8:13 PM   Specimen: Nasopharyngeal Swab  Result Value Ref Range Status   SARS Coronavirus 2 NEGATIVE NEGATIVE Final    Comment: (NOTE) SARS-CoV-2 target nucleic acids are NOT DETECTED. The SARS-CoV-2 RNA is generally detectable in upper and lower respiratory specimens during the acute phase of infection. Negative results do not preclude SARS-CoV-2 infection, do not rule out co-infections with other pathogens, and should not be used as the sole basis for treatment or other patient management decisions. Negative results must be combined with clinical observations, patient history, and epidemiological information. The expected result is Negative. Fact Sheet for Patients: SugarRoll.be Fact Sheet for Healthcare Providers: https://www.woods-mathews.com/ This test is not yet approved or cleared by the [place] FDA and  has been authorized for detection and/or diagnosis of SARS-CoV-2 by FDA under an Emergency Use Authorization (EUA). This EUA will remain  in effect (meaning this test can be used) for the duration of the COVID-19 declaration under Section 56 4(b)(1) of the Act, 21 U.S.C. section 360bbb-3(b)(1), unless the authorization is terminated or revoked sooner. Performed at [facility],  [place] [address], [place], [place] [address]          Radiology Studies: MR ANGIO HEAD WO CONTRAST  Result Date: 06/07/2019 CLINICAL DATA:  Encephalopathy and speech difficulty. Left-sided neglect. EXAM: MRI HEAD WITHOUT CONTRAST MRA HEAD WITHOUT CONTRAST TECHNIQUE: Multiplanar, multiecho pulse sequences of the brain and surrounding structures were obtained without intravenous contrast. Angiographic images of the head were obtained using MRA technique without contrast. COMPARISON:  None. FINDINGS: MRI HEAD FINDINGS BRAIN: There is no acute infarct, acute hemorrhage or mass effect. The midline structures are normal. Multifocal white matter hyperintensity, most commonly due to chronic ischemic microangiopathy. The CSF spaces are normal for age, with no hydrocephalus. Susceptibility-sensitive sequences show no chronic microhemorrhage or superficial siderosis. SKULL AND UPPER CERVICAL SPINE: The visualized skull base, calvarium, upper cervical spine and extracranial soft tissues are normal. SINUSES/ORBITS: No fluid levels or advanced mucosal thickening. No mastoid or middle ear effusion. The orbits are normal. MRA HEAD FINDINGS POSTERIOR CIRCULATION: --Basilar artery: Normal. --Posterior cerebral arteries: Normal. Both originate from the basilar artery. --Superior cerebellar arteries: Normal. --Inferior cerebellar arteries: Normal anterior and posterior inferior cerebellar arteries. ANTERIOR CIRCULATION: --Intracranial  internal carotid arteries: Normal. --Anterior cerebral arteries: Normal. Both A1 segments are present. Patent anterior communicating artery. --Middle cerebral arteries: Normal. --Posterior communicating arteries: Present on the right, absent on the left. IMPRESSION: 1. No acute intracranial abnormality. 2. Chronic small vessel ischemia. 3. Normal intracranial MRA. Electronically Signed   By: Ulyses Jarred M.D.   On: 06/07/2019 04:28   MR BRAIN WO CONTRAST  Result Date: 06/07/2019 CLINICAL DATA:   Encephalopathy and speech difficulty. Left-sided neglect. EXAM: MRI HEAD WITHOUT CONTRAST MRA HEAD WITHOUT CONTRAST TECHNIQUE: Multiplanar, multiecho pulse sequences of the brain and surrounding structures were obtained without intravenous contrast. Angiographic images of the head were obtained using MRA technique without contrast. COMPARISON:  None. FINDINGS: MRI HEAD FINDINGS BRAIN: There is no acute infarct, acute hemorrhage or mass effect. The midline structures are normal. Multifocal white matter hyperintensity, most commonly due to chronic ischemic microangiopathy. The CSF spaces are normal for age, with no hydrocephalus. Susceptibility-sensitive sequences show no chronic microhemorrhage or superficial siderosis. SKULL AND UPPER CERVICAL SPINE: The visualized skull base, calvarium, upper cervical spine and extracranial soft tissues are normal. SINUSES/ORBITS: No fluid levels or advanced mucosal thickening. No mastoid or middle ear effusion. The orbits are normal. MRA HEAD FINDINGS POSTERIOR CIRCULATION: --Basilar artery: Normal. --Posterior cerebral arteries: Normal. Both originate from the basilar artery. --Superior cerebellar arteries: Normal. --Inferior cerebellar arteries: Normal anterior and posterior inferior cerebellar arteries. ANTERIOR CIRCULATION: --Intracranial internal carotid arteries: Normal. --Anterior cerebral arteries: Normal. Both A1 segments are present. Patent anterior communicating artery. --Middle cerebral arteries: Normal. --Posterior communicating arteries: Present on the right, absent on the left. IMPRESSION: 1. No acute intracranial abnormality. 2. Chronic small vessel ischemia. 3. Normal intracranial MRA. Electronically Signed   By: Ulyses Jarred M.D.   On: 06/07/2019 04:28   ECHOCARDIOGRAM COMPLETE  Result Date: 06/07/2019   ECHOCARDIOGRAM REPORT   Patient Name:   Sarah Fletcher Date of Exam: 06/07/2019 Medical Rec #:  [id number]    Height:       60.0 in Accession #:     [id number]   Weight:       142.2 lb Date of Birth:  [birth year]   BSA:          1.61 m Patient Age:    20 years     BP:           152/83 mmHg Patient Gender: F            HR:           71 bpm. Exam Location:  Inpatient Procedure: 2D Echo, Cardiac Doppler and Color Doppler Indications:    TIA 435.9 / G45.9  History:        Patient has no prior history of Echocardiogram examinations.                 Hypothyroidism.  Sonographer:    Jonelle Sidle Dance Referring Phys: [place]  1. Left ventricular ejection fraction, by visual estimation, is 55 to 60%. The left ventricle has normal function. There is no left ventricular hypertrophy.  2. Left ventricular diastolic parameters are indeterminate.  3. The left ventricle has no regional wall motion abnormalities.  4. Global right ventricle has normal systolic function.The right ventricular size is normal. No increase in right ventricular wall thickness.  5. Left atrial size was normal.  6. Right atrial size was normal.  7. Small pericardial effusion.  8. The pericardial effusion is circumferential.  9. The mitral valve is normal in  structure. No evidence of mitral valve regurgitation. No evidence of mitral stenosis. 10. The tricuspid valve is normal in structure. Tricuspid valve regurgitation is mild. 11. The aortic valve is tricuspid. Aortic valve regurgitation is not visualized. No evidence of aortic valve sclerosis or stenosis. 12. The pulmonic valve was not well visualized. Pulmonic valve regurgitation is not visualized. 13. Normal pulmonary artery systolic pressure. 14. The inferior vena cava is normal in size with greater than 50% respiratory variability, suggesting right atrial pressure of 3 mmHg. FINDINGS  Left Ventricle: Left ventricular ejection fraction, by visual estimation, is 55 to 60%. The left ventricle has normal function. The left ventricle has no regional wall motion abnormalities. There is no left ventricular hypertrophy. Left  ventricular diastolic parameters are indeterminate. Right Ventricle: The right ventricular size is normal. No increase in right ventricular wall thickness. Global RV systolic function is has normal systolic function. The tricuspid regurgitant velocity is 2.25 m/s, and with an assumed right atrial pressure  of 3 mmHg, the estimated right ventricular systolic pressure is normal at 23.2 mmHg. Left Atrium: Left atrial size was normal in size. Right Atrium: Right atrial size was normal in size Pericardium: A small pericardial effusion is present. The pericardial effusion is circumferential. Mitral Valve: The mitral valve is normal in structure. No evidence of mitral valve regurgitation. No evidence of mitral valve stenosis by observation. Tricuspid Valve: The tricuspid valve is normal in structure. Tricuspid valve regurgitation is mild. Aortic Valve: The aortic valve is tricuspid. Aortic valve regurgitation is not visualized. The aortic valve is structurally normal, with no evidence of sclerosis or stenosis. Pulmonic Valve: The pulmonic valve was not well visualized. Pulmonic valve regurgitation is not visualized. Pulmonic regurgitation is not visualized. No evidence of pulmonic stenosis. Aorta: The aortic root is normal in size and structure. Pulmonary Artery: No pulmonary hypertension, PASP is 28 mmHg. Venous: The inferior vena cava is normal in size with greater than 50% respiratory variability, suggesting right atrial pressure of 3 mmHg. IAS/Shunts: No atrial level shunt detected by color flow Doppler.  LEFT VENTRICLE PLAX 2D LVIDd:         4.05 cm  Diastology LVIDs:         3.12 cm  LV e' lateral:   5.22 cm/s LV PW:         1.11 cm  LV E/e' lateral: 13.2 LV IVS:        0.85 cm  LV e' medial:    5.87 cm/s LVOT diam:     1.90 cm  LV E/e' medial:  11.8 LV SV:         34 ml LV SV Index:   20.07 LVOT Area:     2.84 cm  RIGHT VENTRICLE             IVC RV Basal diam:  2.51 cm     IVC diam: 1.73 cm RV S prime:     10.90  cm/s TAPSE (M-mode): 1.6 cm LEFT ATRIUM             Index       RIGHT ATRIUM          Index LA diam:        3.40 cm 2.11 cm/m  RA Area:     9.67 cm LA Vol (A2C):   27.4 ml 16.97 ml/m RA Volume:   17.90 ml 11.09 ml/m LA Vol (A4C):   27.4 ml 16.97 ml/m LA Biplane Vol: 28.8 ml 17.84 ml/m  AORTIC  VALVE LVOT Vmax:   117.00 cm/s LVOT Vmean:  83.100 cm/s LVOT VTI:    0.248 m  AORTA Ao Root diam: 3.20 cm Ao Asc diam:  2.90 cm MITRAL VALVE                        TRICUSPID VALVE MV Area (PHT): 2.95 cm             TR Peak grad:   20.2 mmHg MV PHT:        74.53 msec           TR Vmax:        252.00 cm/s MV Decel Time: 257 msec MV E velocity: 69.00 cm/s 103 cm/s  SHUNTS MV A velocity: 74.10 cm/s 70.3 cm/s Systemic VTI:  0.25 m MV E/A ratio:  0.93       1.5       Systemic Diam: 1.90 cm  Carlyle Dolly MD Electronically signed by Carlyle Dolly MD Signature Date/Time: 06/07/2019/10:42:30 AM    Final    CT HEAD CODE STROKE WO CONTRAST  Result Date: 06/06/2019 CLINICAL DATA:  Code stroke.  Left-sided neglect. EXAM: CT HEAD WITHOUT CONTRAST TECHNIQUE: Contiguous axial images were obtained from the base of the skull through the vertex without intravenous contrast. COMPARISON:  CT head 05/21/2018 FINDINGS: Brain: Negative for acute infarct, hemorrhage, mass Cerebral atrophy. Mild changes in the white matter and internal capsule bilaterally appear chronic. Vascular: Negative for hyperdense vessel Skull: Negative Sinuses/Orbits: Air-fluid level sphenoid sinus. Remaining sinuses clear. Bilateral cataract surgery. Other: None ASPECTS (Delta Stroke Program Early CT Score) - Ganglionic level infarction (caudate, lentiform nuclei, internal capsule, insula, M1-M3 cortex): 7 - Supraganglionic infarction (M4-M6 cortex): 3 Total score (0-10 with 10 being normal): 10 IMPRESSION: 1. No acute intracranial abnormality 2. ASPECTS is 10 3. Atrophy and chronic white matter ischemia 4. Air-fluid level sphenoid sinus. 5. Results texted to  Dr. Cheral Marker Electronically Signed   By: Franchot Gallo M.D.   On: 06/06/2019 15:35        Scheduled Meds: . aspirin  81 mg Oral Daily  . atorvastatin  40 mg Oral q1800  . clopidogrel  75 mg Oral Daily  . FLUoxetine  10 mg Oral Daily  . levothyroxine  50 mcg Oral QAC breakfast  . meloxicam  7.5 mg Oral Daily  . saccharomyces boulardii  250 mg Oral q12n4p  . senna-docusate  1 tablet Oral QHS   Continuous Infusions: . sodium chloride 10 mL/hr at 06/07/19 0453     LOS: 0 days    Time spent: 35 minutes    Dana Allan, MD  [facility] Pager #: [phone] 7PM-7AM contact night coverage as above

## 2019-06-07 NOTE — Care Management Obs Status (Signed)
[facility] NOTIFICATION   Patient Details  Name: Sarah Fletcher MRN: [id number] Date of Birth: 09/07/1929   Medicare Observation Status Notification Given:  Yes    Carles Collet, RN 06/07/2019, 4:28 PM

## 2019-06-07 NOTE — Evaluation (Signed)
Occupational Therapy Evaluation Patient Details Name: Sarah Fletcher MRN: [id number] DOB: 06/15/1930 Today's Date: 06/07/2019    History of Present Illness Patient is an 85 year old female admitted with right side vision loss, slurred speech. PMH to include: Hypothyroidism, TIA, OA   Clinical Impression   PTA pt living in retirement community and independent for BADL. Daughter present during session and supportive. Pt is able to complete bed mobility at min guard and sit <> stand with fluctuating min A- min guard. Pt endorses dizziness with position change, dtr reports this is not new. Pt able to complete functional mobility to BR in room with min guard- min A for toilet transfer with RW. She appeared to have some difficulty navigating the environment and will need further visual testing. She plans to return to dtrs home for a few nights then return to her retirement community apartment from there. Recommend HHOT at d/c for continued safety progression in home environment. Will continue to follow per POC Listed below.     Follow Up Recommendations  Home health OT;Supervision/Assistance - 24 hour    Equipment Recommendations  3 in 1 bedside commode    Recommendations for Other Services       Precautions / Restrictions Precautions Precautions: Fall Precaution Comments: dizziness with functional mobility Restrictions Weight Bearing Restrictions: No      Mobility Bed Mobility Overal bed mobility: Needs Assistance Bed Mobility: Supine to Sit;Sit to Supine     Supine to sit: Min guard Sit to supine: Min guard   General bed mobility comments: increased time required, reports she is dizzy, daughter states this is not new.  Transfers Overall transfer level: Needs assistance Equipment used: None;Rolling walker (2 wheeled) Transfers: Sit to/from Stand Sit to Stand: Min assist         General transfer comment: min assist with sit to stand from low commode, low bed. Unsteady with  initial standing balance    Balance Overall balance assessment: Needs assistance Sitting-balance support: Feet supported;Bilateral upper extremity supported Sitting balance-Leahy Scale: Fair Sitting balance - Comments: close supervision to min guard needed for sitting at edge of bed due to reported dizziness.   Standing balance support: Bilateral upper extremity supported;During functional activity Standing balance-Leahy Scale: Fair Standing balance comment: reliant on external support                           ADL either performed or assessed with clinical judgement   ADL Overall ADL's : Needs assistance/impaired Eating/Feeding: Set up;Sitting   Grooming: Min guard;Standing   Upper Body Bathing: Minimal assistance;Sitting   Lower Body Bathing: Minimal assistance;Sit to/from stand;Sitting/lateral leans   Upper Body Dressing : Set up;Sitting   Lower Body Dressing: Minimal assistance;Sit to/from stand;Sitting/lateral leans   Toilet Transfer: Min guard;Cueing for Armed forces operational officer Details (indicate cue type and reason): cueing for safe hand placement on grab bars for transfer, also navigational cues for RW in environment Toileting- Water quality scientist and Hygiene: Min guard;Sitting/lateral lean;Sit to/from stand   Tub/ Shower Transfer: Min guard;Shower seat;Ambulation;Rolling walker   Functional mobility during ADLs: Min guard;Cueing for safety;Cueing for sequencing;Rolling walker General ADL Comments: pt limited by dizziness and decreased activity tolerance impacting BADL safety     Vision Baseline Vision/History: No visual deficits Patient Visual Report: Other (comment)(dizziness) Additional Comments: pt endorses constant dizziness, and that she had PT/OT for this in the past. Pt shares on admin her R eye lost all vision, but has poor  description of vision now. Seems to have some difficulty navigating environment and scanning/tracking.     Perception      Praxis      Pertinent Vitals/Pain Pain Assessment: No/denies pain     Hand Dominance     Extremity/Trunk Assessment Upper Extremity Assessment Upper Extremity Assessment: Overall WFL for tasks assessed   Lower Extremity Assessment Lower Extremity Assessment: Defer to PT evaluation   Cervical / Trunk Assessment Cervical / Trunk Assessment: Kyphotic   Communication Communication Communication: No difficulties   Cognition Arousal/Alertness: Awake/alert Behavior During Therapy: WFL for tasks assessed/performed Overall Cognitive Status: Within Functional Limits for tasks assessed                                     General Comments       Exercises     Shoulder Instructions      Home Living Family/patient expects to be discharged to:: Private residence Living Arrangements: Children Available Help at Discharge: Family Type of Home: House Home Access: Stairs to enter     Home Layout: Two level               Home Equipment: Environmental consultant - 2 wheels   Additional Comments: pt plans to return to dtrs for a few nights, then back to ILF      Prior Functioning/Environment Level of Independence: Independent                 OT Problem List: Decreased strength;Decreased knowledge of use of DME or AE;Decreased activity tolerance;Impaired balance (sitting and/or standing)      OT Treatment/Interventions: Self-care/ADL training;Therapeutic exercise;Patient/family education;Balance training;Energy conservation;Therapeutic activities;DME and/or AE instruction;Visual/perceptual remediation/compensation    OT Goals(Current goals can be found in the care plan section) Acute Rehab OT Goals Patient Stated Goal: to go to daughter's house and then home to her apartment OT Goal Formulation: With patient Time For Goal Achievement: 06/21/19 Potential to Achieve Goals: Good  OT Frequency: Min 2X/week   Barriers to D/C:            Co-evaluation PT/OT/SLP  Co-Evaluation/Treatment: Yes Reason for Co-Treatment: For patient/therapist safety;To address functional/ADL transfers PT goals addressed during session: Mobility/safety with mobility;Balance;Proper use of DME OT goals addressed during session: ADL's and self-care;Strengthening/ROM      AM-PAC OT "6 Clicks" Daily Activity     Outcome Measure Help from another person eating meals?: A Little Help from another person taking care of personal grooming?: A Little Help from another person toileting, which includes using toliet, bedpan, or urinal?: A Little Help from another person bathing (including washing, rinsing, drying)?: A Little Help from another person to put on and taking off regular upper body clothing?: A Little Help from another person to put on and taking off regular lower body clothing?: A Little 6 Click Score: 18   End of Session Equipment Utilized During Treatment: Gait belt;Rolling walker Nurse Communication: Mobility status  Activity Tolerance: Patient tolerated treatment well Patient left: in bed;with bed alarm set;with family/visitor present  OT Visit Diagnosis: Unsteadiness on feet (R26.81);Other abnormalities of gait and mobility (R26.89);Muscle weakness (generalized) (M62.81);Dizziness and giddiness (R42)                Time: [id number] OT Time Calculation (min): 12 min Charges:  OT General Charges $OT Visit: 1 Visit OT Evaluation $OT Eval Moderate Complexity: 1 Mod  Zenovia Jarred, MSOT, OTR/L [employer] OT/ Acute  Relief OT [facility] Office: [phone]  Zenovia Jarred 06/07/2019, 2:07 PM

## 2019-06-07 NOTE — Progress Notes (Addendum)
STROKE TEAM PROGRESS NOTE   INTERVAL HISTORY Her daughter is at the bedside.  Pt slightly sleepy, however able to open eyes on minimal stimulation and cooperation with examination.  As per daughter, patient lives in assisted living facility, however since Covid outbreak in [place], patient was mostly confined in her own apartment, patient seem to be less active with depressed mood since then.  Patient also complains of mild frontal headache frequently for the last several months, no photo or phonophobia, no nausea vomiting, only occasion taking Tylenol.  However, yesterday, patient was on the phone with daughter and complained of acute onset right blurry vision.  Left eye vision intact.  Associate with mild headache bifrontal and then moved to right periorbital as well as mild dysarthria.  ER patient has some confusion lasting for 3 minutes as per daughter.  Overnight, CT, MRI/MRA all negative.  OBJECTIVE Vitals:   06/07/19 0121 06/07/19 0325 06/07/19 0600 06/07/19 0904  BP: 128/75 (!) 152/83  120/72  Pulse: 64 72  71  Resp: '14 16 15 18  '$ Temp: 97.8 F (36.6 C) 97.9 F (36.6 C)  98.4 F (36.9 C)  TempSrc: Oral Oral  Oral  SpO2: 98% 97%  97%  Weight:        CBC:  Recent Labs  Lab 06/06/19 1525 06/06/19 1528  WBC  --  9.3  NEUTROABS  --  6.5  HGB 15.3* 14.5  HCT 45.0 44.6  MCV  --  95.3  PLT  --  458    Basic Metabolic Panel:  Recent Labs  Lab 06/06/19 1525 06/06/19 1528  NA 135 136  K 3.7 3.8  CL 102 101  CO2  --  22  GLUCOSE 113* 120*  BUN 19 16  CREATININE 0.90 1.11*  CALCIUM  --  9.6    Lipid Panel:     Component Value Date/Time   CHOL 271 (H) 06/07/2019 0252   TRIG 51 06/07/2019 0252   HDL 106 06/07/2019 0252   CHOLHDL 2.6 06/07/2019 0252   VLDL 10 06/07/2019 0252   LDLCALC 155 (H) 06/07/2019 0252   HgbA1c:  Lab Results  Component Value Date   HGBA1C 6.0 (H) 06/07/2019   Urine Drug Screen: No results found for: LABOPIA, COCAINSCRNUR, LABBENZ, AMPHETMU,  THCU, LABBARB  Alcohol Level No results found for: ETH  IMAGING  MR ANGIO HEAD WO CONTRAST 06/07/2019 IMPRESSION:  1. No acute intracranial abnormality.  2. Chronic small vessel ischemia.  3. Normal intracranial MRA.   MR BRAIN WO CONTRAST 06/07/2019  IMPRESSION:  1. No acute intracranial abnormality.  2. Chronic small vessel ischemia.  3. Normal intracranial MRA.   CT HEAD CODE STROKE WO CONTRAST 06/06/2019 IMPRESSION:  1. No acute intracranial abnormality  2. ASPECTS is 10 3. Atrophy and chronic white matter ischemia  4. Air-fluid level sphenoid sinus.   Transthoracic Echocardiogram  06/07/2019 IMPRESSIONS  1. Left ventricular ejection fraction, by visual estimation, is 55 to 60%. The left ventricle has normal function. There is no left ventricular hypertrophy.  2. Left ventricular diastolic parameters are indeterminate.  3. The left ventricle has no regional wall motion abnormalities.  4. Global right ventricle has normal systolic function.The right ventricular size is normal. No increase in right ventricular wall thickness.  5. Left atrial size was normal.  6. Right atrial size was normal.  7. Small pericardial effusion.  8. The pericardial effusion is circumferential.  9. The mitral valve is normal in structure. No evidence of mitral valve regurgitation.  No evidence of mitral stenosis. 10. The tricuspid valve is normal in structure. Tricuspid valve regurgitation is mild. 11. The aortic valve is tricuspid. Aortic valve regurgitation is not visualized. No evidence of aortic valve sclerosis or stenosis. 12. The pulmonic valve was not well visualized. Pulmonic valve regurgitation is not visualized. 13. Normal pulmonary artery systolic pressure. 14. The inferior vena cava is normal in size with greater than 50% respiratory variability, suggesting right atrial pressure of 3 mmHg.   PHYSICAL EXAM  Temp:  [97.4 F (36.3 C)-98.7 F (37.1 C)] 98.7 F (37.1 C) (12/13  1150) Pulse Rate:  [64-89] 73 (12/13 1150) Resp:  [14-20] 18 (12/13 1150) BP: (114-175)/(72-93) 114/75 (12/13 1150) SpO2:  [96 %-100 %] 96 % (12/13 1150)  General - Well nourished, well developed, in no apparent distress.  Ophthalmologic - fundi not visualized due to noncooperation.  Cardiovascular - Regular rhythm and rate.  Mental Status -  Level of arousal and orientation to time, place, and person were intact, however told me her age wrong. Language including expression, naming, repetition, comprehension was assessed and found intact. Attention span and concentration were normal.  Cranial Nerves II - XII - II - Visual field intact OU. III, IV, VI - Extraocular movements intact. V - Facial sensation intact bilaterally. VII - Facial movement intact bilaterally. VIII - Hearing & vestibular intact bilaterally. X - Palate elevates symmetrically. XI - Chin turning & shoulder shrug intact bilaterally. XII - Tongue protrusion intact.  Motor Strength - The patient's strength was normal in all extremities and pronator drift was absent.  Bulk was normal and fasciculations were absent.   Motor Tone - Muscle tone was assessed at the neck and appendages and was normal.  Reflexes - The patient's reflexes were symmetrical in all extremities and she had no pathological reflexes.  Sensory - Light touch, temperature/pinprick were assessed and were symmetrical.    Coordination - The patient had normal movements in the hands with no ataxia or dysmetria.  Tremor was absent.  Gait and Station - deferred.   ASSESSMENT/PLAN Ms. Sarah Fletcher is a 85 y.o. female with history of arthritis and hypothyroidism presenting with acute onset of speech difficulty, confusion, inability to follow instructions, neglect on the left and right sided gaze preference. She did not receive IV t-PA due to risks outweighed benefits.  Occular migarine vs. Right amaurosis fugax     Resultant  Transient right eye  vision disturbance  Code Stroke CT Head - No acute intracranial abnormality. ASPECTS is 10.   CT head - not ordered  MRI head - No acute intracranial abnormality.  MRA head - No acute intracranial abnormality.  Carotid Doppler unremarkable  2D Echo - EF 55 to 60%. No cardiac source of emboli identified.   Sars Corona Virus 2 - negative  LDL - 155  HgbA1c - 6.0  VTE prophylaxis - SCDs  No antithrombotic prior to admission, now on aspirin 81 mg daily and clopidogrel 75 mg daily.  Continue DAPT for 3 weeks and then aspirin alone.  Patient counseled to be compliant with her antithrombotic medications  Ongoing aggressive stroke risk factor management  Therapy recommendations: Home PT/OT  Disposition: Likely home with daughter today  HA  Likely tension headache  Related to recent depressed mood in ALF with confinement due to COVID policy  ESR and CRP normal  Denies jaw claudication   Less likely for temporal arteritis  Tylenol as needed  Hypertension  Home BP meds: none  Current BP meds: none   Stable now . Gradually normalize in 3-5 days  . Long-term BP goal normotensive  Hyperlipidemia  Home Lipid lowering medication: none   LDL 155, goal < 70  Current lipid lowering medication: Lipitor 40 mg daily   Continue statin at discharge  Other Stroke Risk Factors  Advanced age  ETOH use, advised to drink no more than 1 alcoholic beverage per day.  Other [facility] day # 0  Neurology will sign off. Please call with questions. Pt will follow up with stroke clinic NP at [facility] in about 4 weeks. Thanks for the consult.  Rosalin Hawking, MD PhD Stroke Neurology 06/07/2019 4:30 PM   To contact Stroke Continuity provider, please refer to [email or web address] After hours, contact General Neurology

## 2019-06-07 NOTE — Progress Notes (Signed)
  Echocardiogram 2D Echocardiogram has been performed.  Sarah Fletcher 06/07/2019, 10:09 AM

## 2019-06-07 NOTE — Progress Notes (Addendum)
CSW acknowledges patient needs to return back to her independent living facility. CSW contacted the facility to see what home health agency they contract with. CSW spoke with a Therapist, sports. CSW was informed that their social worker would reach out to the [place] and discuss disposition planning.   CSW is awaiting a return phone call from [facility]. CSW will continue to follow and assist with discharge planning.   Domenic Schwab, MSW, [place] Worker [facility]  [phone]

## 2019-06-07 NOTE — Progress Notes (Signed)
CSW received a return phone call from SW at [address], [place]. She stated she would have to speak with admissions in the morning to see if the patient had to quarantine for 14 days in the acuity unit or if she would be able to go back to her apartment.   CSW called and informed RN. CSW will plan on following up with [facility] Admissions on Monday.   CSW will continue to follow and assist with discharge planning.   Domenic Schwab, MSW, [place] Worker [facility]  [phone]

## 2019-06-08 DIAGNOSIS — E039 Hypothyroidism, unspecified: Secondary | ICD-10-CM | POA: Diagnosis not present

## 2019-06-08 DIAGNOSIS — E785 Hyperlipidemia, unspecified: Secondary | ICD-10-CM | POA: Diagnosis not present

## 2019-06-08 LAB — HIGH SENSITIVITY CRP: CRP, High Sensitivity: 3.41 mg/L — ABNORMAL HIGH (ref 0.00–3.00)

## 2019-06-08 MED ORDER — ACETAMINOPHEN 325 MG PO TABS: 650.0000 mg | ORAL_TABLET | ORAL | 0 refills | Status: DC | PRN

## 2019-06-08 MED ORDER — MELOXICAM 7.5 MG PO TABS: 7.5000 mg | ORAL_TABLET | Freq: Every day | ORAL | 0 refills | Status: DC | PRN

## 2019-06-08 MED ORDER — ATORVASTATIN CALCIUM 40 MG PO TABS: 40.0000 mg | ORAL_TABLET | Freq: Every day | ORAL | 1 refills | Status: AC

## 2019-06-08 MED ORDER — ASPIRIN 81 MG PO CHEW: 81.0000 mg | CHEWABLE_TABLET | Freq: Every day | ORAL | 1 refills | Status: AC

## 2019-06-08 MED ORDER — CLOPIDOGREL BISULFATE 75 MG PO TABS: 75.0000 mg | ORAL_TABLET | Freq: Every day | ORAL | 0 refills | Status: AC

## 2019-06-08 NOTE — Progress Notes (Signed)
Occupational Therapy Treatment Patient Details Name: Sarah Fletcher MRN: [id number] DOB: 24-Feb-1930 Today's Date: 06/08/2019    History of present illness Patient is an 85 year old female admitted with right side vision loss, slurred speech. PMH to include: Hypothyroidism, TIA, OA   OT comments  Pt continues to progress toward established OT goals. Pt required supervision during upper body and lower body dressing while sitting edge of bed. She demonstrated preference for furniture walking during mobility and required minguard for safety during ambulation to/from the toilet. Pt with anticipated d/c home this date. All additional OT needs to be addressed at next venue of care. OT to sign off.   Follow Up Recommendations  Home health OT;Supervision/Assistance - 24 hour    Equipment Recommendations  3 in 1 bedside commode    Recommendations for Other Services      Precautions / Restrictions Precautions Precautions: Fall Restrictions Weight Bearing Restrictions: No       Mobility Bed Mobility Overal bed mobility: Modified Independent             General bed mobility comments: increased time and effort  Transfers Overall transfer level: Needs assistance Equipment used: None Transfers: Sit to/from Stand Sit to Stand: Supervision         General transfer comment: supervision for safety    Balance Overall balance assessment: Needs assistance Sitting-balance support: Feet supported;No upper extremity supported Sitting balance-Leahy Scale: Good Sitting balance - Comments: able to don UB and LB clothing sitting EOB and reaching over to access BLE   Standing balance support: Single extremity supported;During functional activity Standing balance-Leahy Scale: Fair Standing balance comment: demonstrated preference for single UE support                           ADL either performed or assessed with clinical judgement   ADL Overall ADL's : Needs  assistance/impaired     Grooming: Supervision/safety;Standing           Upper Body Dressing : Supervision/safety;Sitting Upper Body Dressing Details (indicate cue type and reason): doneed UB clothing sitting EOB Lower Body Dressing: Supervision/safety;Sit to/from stand Lower Body Dressing Details (indicate cue type and reason): pt donned LB clothing while sitting EOB Toilet Transfer: Min guard;Ambulation Toilet Transfer Details (indicate cue type and reason): minguard for safety, pt furniture walking Toileting- Clothing Manipulation and Hygiene: Supervision/safety;Sit to/from stand       Functional mobility during ADLs: Min guard;Supervision/safety General ADL Comments: pt with no complaints of dizziness during ADL;she required minguard during functional mobiltiy without assistive device;     Vision   Vision Assessment?: Yes;No apparent visual deficits Eye Alignment: Within Functional Limits Ocular Range of Motion: Within Functional Limits Alignment/Gaze Preference: Within Defined Limits Tracking/Visual Pursuits: Able to track stimulus in all quads without difficulty Saccades: Within functional limits Convergence: Within functional limits Additional Comments: pt reports vision is at baseline   Perception     Praxis      Cognition Arousal/Alertness: Awake/alert Behavior During Therapy: WFL for tasks assessed/performed Overall Cognitive Status: Within Functional Limits for tasks assessed                                 General Comments: pt impulsive at times and rushes through things, daughter reports this is baseline;        Exercises     Shoulder Instructions  General Comments pt's daughter present during session;educated pt on importance of social distancing with return home;pt appeared to have decreased awareness of importance    Pertinent Vitals/ Pain       Pain Assessment: No/denies pain  Home Living                                           Prior Functioning/Environment              Frequency  Min 2X/week        Progress Toward Goals  OT Goals(current goals can now be found in the care plan section)  Progress towards OT goals: Progressing toward goals  Acute Rehab OT Goals Patient Stated Goal: to go home to her apartment OT Goal Formulation: With patient Time For Goal Achievement: 06/21/19 Potential to Achieve Goals: Good ADL Goals Pt Will Perform Lower Body Bathing: with min guard assist;sitting/lateral leans;sit to/from stand Pt Will Perform Lower Body Dressing: with min guard assist;sit to/from stand;sitting/lateral leans Pt Will Transfer to Toilet: with min guard assist;ambulating;regular height toilet Pt Will Perform Tub/Shower Transfer: with min guard assist;shower seat;ambulating;rolling walker Additional ADL Goal #1: Pt will navigate BADL environment with <3 safety cues Additional ADL Goal #2: Pt will verbalize 3-5 fall prevention strategies in the home  Plan Discharge plan remains appropriate    Co-evaluation                 AM-PAC OT "6 Clicks" Daily Activity     Outcome Measure   Help from another person eating meals?: A Little Help from another person taking care of personal grooming?: A Little Help from another person toileting, which includes using toliet, bedpan, or urinal?: A Little Help from another person bathing (including washing, rinsing, drying)?: A Little Help from another person to put on and taking off regular upper body clothing?: A Little Help from another person to put on and taking off regular lower body clothing?: A Little 6 Click Score: 18    End of Session Equipment Utilized During Treatment: Gait belt  OT Visit Diagnosis: Unsteadiness on feet (R26.81);Other abnormalities of gait and mobility (R26.89);Muscle weakness (generalized) (M62.81);Dizziness and giddiness (R42)   Activity Tolerance Patient tolerated treatment well   Patient  Left in bed;with call bell/phone within reach;with nursing/sitter in room;with family/visitor present   Nurse Communication Mobility status        Time: 1212-1232 OT Time Calculation (min): 20 min  Charges: OT General Charges $OT Visit: 1 Visit OT Treatments $Self Care/Home Management : 8-22 mins  Dorinda Hill OTR/L Day Office: [place] 06/08/2019, 1:33 PM

## 2019-06-08 NOTE — Progress Notes (Signed)
CSW called [facility] again, and left a message inquiring about the patient coming back. CSW is awaiting a return phone call.   CSW will continue to follow and assist with disposition planning.   Domenic Schwab, MSW, [place] Worker [facility]  [phone]

## 2019-06-08 NOTE — Progress Notes (Signed)
Patient ready for discharge to home; discharge instructions given and reviewed; Rx sent electronically; daughter present at bedside to review instructions with the patient..  Patient discharged out via wheelchair accompanied home by her daughter.

## 2019-06-08 NOTE — TOC Initial Note (Signed)
Transition of Care [facility]) - Initial/Assessment Note    Patient Details  Name: Sarah Fletcher MRN: [id number] Date of Birth: 06/07/1930  [facility]) CM/SW Contact:    Gelene Mink, [place] Phone Number: 06/08/2019, 12:06 PM  Clinical Narrative:                  Patient to discharge back to her apartment at [place] with home health PT, OT, and nurse aid.   Awaiting discharge summary from MD. Family to provide transportation home.   Expected Discharge Plan: [place] Barriers to Discharge: No Barriers Identified   Patient Goals and CMS Choice Patient states their goals for this hospitalization and ongoing recovery are:: Pt wants to go back to her apartment. CMS Medicare.gov Compare Post Acute Care list provided to:: Patient Represenative (must comment) Choice offered to / list presented to : Adult Children  Expected Discharge Plan and Services Expected Discharge Plan: [place] In-house Referral: Clinical Social Work   Post Acute Care Choice: [place] arrangements for the past 2 months: [place] Expected Discharge Date: 06/08/19               DME Arranged: N/A(Declined DME)         HH Arranged: PT, OT, Nurse's Aide HH Agency: [facility] Date [facility] Agency Contacted: 06/08/19 Time HH Agency Contacted: [phone]    Prior Living Arrangements/Services Living arrangements for the past 2 months: [place] Lives with:: Facility Resident Patient language and need for interpreter reviewed:: No Do you feel safe going back to the place where you live?: Yes      Need for Family Participation in Patient Care: Yes (Comment) Care giver support system in place?: Yes (comment) Current home services: DME Criminal Activity/Legal Involvement Pertinent to Current Situation/Hospitalization: No - Comment as needed  Activities of Daily Living Home Assistive Devices/Equipment:  Eyeglasses, Environmental consultant (specify type) ADL Screening (condition at time of admission) Patient's cognitive ability adequate to safely complete daily activities?: Yes Is the patient deaf or have difficulty hearing?: No Does the patient have difficulty seeing, even when wearing glasses/contacts?: No Does the patient have difficulty concentrating, remembering, or making decisions?: No Patient able to express need for assistance with ADLs?: Yes Does the patient have difficulty dressing or bathing?: No Independently performs ADLs?: Yes (appropriate for developmental age) Does the patient have difficulty walking or climbing stairs?: Yes Weakness of Legs: Both Weakness of Arms/Hands: None  Permission Sought/Granted Permission sought to share information with : Case Manager Permission granted to share information with : Yes, Verbal Permission Granted  Share Information with NAME: Cloyde Reams  Permission granted to share info w AGENCY: [facility]  Permission granted to share info w Relationship: Daughter     Emotional Assessment Appearance:: Appears stated age Attitude/Demeanor/Rapport: Engaged Affect (typically observed): Calm Orientation: : Oriented to Self, Oriented to Place, Oriented to  Time, Oriented to Situation Alcohol / Substance Use: Not Applicable Psych Involvement: No (comment)  Admission diagnosis:  TIA (transient ischemic attack) [G45.9] Patient Active Problem List   Diagnosis Date Noted  . Hypothyroid   . TIA (transient ischemic attack)   . Depression   . Confusion   . Arthritis   . Osteopenia    PCP:  Kelton Pillar, MD Pharmacy:   [facility], [place] - [address] [place] [address] Phone: [phone] Fax: [phone]     Social Determinants  of Health (SDOH) Interventions    Readmission Risk Interventions No flowsheet data found.  

## 2019-06-08 NOTE — Discharge Summary (Signed)
Physician Discharge Summary  Sarah Fletcher XLK:[id number] DOB: December 03, 1929 DOA: 06/06/2019  PCP: Kelton Pillar, MD  Admit date: 06/06/2019 Discharge date: 06/08/2019  Admitted From: [facility] complex Disposition: same  Recommendations for Outpatient Follow-up:  1. Follow up with PCP in 1-2 weeks 2. Please obtain BMP/CBC in one week 3. Please follow up on the following pending results:  Home Health: Yes , HH PT Equipment/Devices: 3 in1  Discharge Condition: Stable CODE STATUS: FULL Diet recommendation: Heart Healthy  Brief/Interim Summary: 85 year old Caucasian female with past medical history significant for osteopenia, hypothyroidism and arthritis.  Patient was admitted with likely TIA.  Patient reported transient episode of blurry vision on the right side.  Collateral information also documented slurred speech.  Patient symptoms have resolved.  Work-up to date is nonrevealing.  Input from neurology, PT OT is appreciated.  Patient will be likely discharge tomorrow with home health PT/OT.  ESR was within normal limits She was admitted and completed stroke up negative as below: she was treated for : Ocular migraine vs Rt amurosis fugax- Seen by neuro and signed off- advised DAPT X 3 then asa alone. completed stroke work up as below  Discharge Diagnoses:  Active Problems:   Hypothyroid   TIA (transient ischemic attack)   Depression   Confusion   Arthritis   Osteopenia  TIA/acute CVA: -Currently on aspirin and Plavix x3 wj then asa alone -Symptoms have resolved -MRI is nonrevealing -Echocardiogram is nonrevealing -ESR was 4 -HbA1c is well 6% -Lipid profile revealed LDL of 155 and HDL of 106  Dyslipidemia: Lipitor PRESCRIBED  Headache: Intermittent Possible tension headache ESR was 4 No history of migraine. Cont prn tylenol  F/u w PCP, neuro as o/p  DVT prophylaxis: Subcutaneous Lovenox Dispo- return to [facility]  MR ANGIO HEAD WO  CONTRAST 06/07/2019 IMPRESSION:  1. No acute intracranial abnormality.  2. Chronic small vessel ischemia.  3. Normal intracranial MRA.   MR BRAIN WO CONTRAST 06/07/2019  IMPRESSION:  1. No acute intracranial abnormality.  2. Chronic small vessel ischemia.  3. Normal intracranial MRA.   CT HEAD CODE STROKE WO CONTRAST 06/06/2019 IMPRESSION:  1. No acute intracranial abnormality  2. ASPECTS is 10 3. Atrophy and chronic white matter ischemia  4. Air-fluid level sphenoid sinus.   Transthoracic Echocardiogram  06/07/2019 IMPRESSIONS 1. Left ventricular ejection fraction, by visual estimation, is 55 to 60%. The left ventricle has normal function. There is no left ventricular hypertrophy. 2. Left ventricular diastolic parameters are indeterminate. 3. The left ventricle has no regional wall motion abnormalities. 4. Global right ventricle has normal systolic function.The right ventricular size is normal. No increase in right ventricular wall thickness. 5. Left atrial size was normal. 6. Right atrial size was normal. 7. Small pericardial effusion. 8. The pericardial effusion is circumferential. 9. The mitral valve is normal in structure. No evidence of mitral valve regurgitation. No evidence of mitral stenosis. 10. The tricuspid valve is normal in structure. Tricuspid valve regurgitation is mild. 11. The aortic valve is tricuspid. Aortic valve regurgitation is not visualized. No evidence of aortic valve sclerosis or stenosis. 12. The pulmonic valve was not well visualized. Pulmonic valve regurgitation is not visualized. 13. Normal pulmonary artery systolic pressure. 14. The inferior vena cava is normal in size with greater than 50% respiratory variability, suggesting right atrial pressure of 3 mmHg  CAROTID DUPLEX Right Carotid: The extracranial vessels were near-normal with only minimal wall  thickening or plaque.  Left Carotid: The extracranial vessels  were near-normal with only minimal wall               thickening or plaque.  Vertebrals:  Bilateral vertebral arteries demonstrate antegrade flow. Subclavians: Normal flow hemodynamics were seen in bilateral subclavian              arteries. Discharge Instructions  Discharge Instructions    Ambulatory referral to Neurology   Complete by: As directed    Follow up with stroke clinic NP (Jessica Vanschaick or Cecille Rubin, if both not available, consider Zachery Dauer, or Ahern) at [facility] in about 4 weeks. Thanks.   Diet - low sodium heart healthy   Complete by: As directed    Discharge instructions   Complete by: As directed    Please call call MD or return to ER for similar or worsening recurring problem that brought you to hospital or if any fever,nausea/vomiting,abdominal pain, uncontrolled pain, chest pain,  shortness of breath or any other alarming symptoms.  Please follow-up your doctor as instructed in a week time and call the office for appointment.  Please avoid alcohol, smoking, or any other illicit substance and maintain healthy habits including taking your regular medications as prescribed.  You were cared for by a hospitalist during your hospital stay. If you have any questions about your discharge medications or the care you received while you were in the hospital after you are discharged, you can call the unit and ask to speak with the hospitalist on call if the hospitalist that took care of you is not available.  Once you are discharged, your primary care physician will handle any further medical issues. Please note that NO REFILLS for any discharge medications will be authorized once you are discharged, as it is imperative that you return to your primary care physician (or establish a relationship with a primary care physician if you do not have one) for your aftercare needs so that they can reassess your need for medications and monitor your lab values   Increase activity  slowly   Complete by: As directed    Increase activity slowly   Complete by: As directed      Allergies as of 06/08/2019      Reactions   Anesthetics, Ester Nausea Only   Codeine Nausea Only   Hydrocodone-acetaminophen Other (See Comments)   Confusion   Percocet [oxycodone-acetaminophen] Nausea Only      Medication List    STOP taking these medications   Culturelle Caps   mupirocin cream 2 % Commonly known as: BACTROBAN   ondansetron 4 MG tablet Commonly known as: Zofran   oxyCODONE 5 MG/5ML solution Commonly known as: ROXICODONE   phenazopyridine 200 MG tablet Commonly known as: PYRIDIUM     TAKE these medications   acetaminophen 325 MG tablet Commonly known as: TYLENOL Take 2 tablets (650 mg total) by mouth every 4 (four) hours as needed for mild pain (or temp > 37.5 C (99.5 F)). What changed:   medication strength  how much to take  when to take this  reasons to take this   aspirin 81 MG chewable tablet Chew 1 tablet (81 mg total) by mouth daily.   atorvastatin 40 MG tablet Commonly known as: LIPITOR Take 1 tablet (40 mg total) by mouth daily at 6 PM.   clopidogrel 75 MG tablet Commonly known as: PLAVIX Take 1 tablet (75 mg total) by mouth daily for 20 days.  denosumab 60 MG/ML Soln injection Commonly known as: PROLIA Inject 60 mg into the skin every 6 (six) months. Administer in upper arm, thigh, or abdomen   FLUoxetine 10 MG tablet Commonly known as: PROZAC Take 10 mg by mouth daily.   levothyroxine 50 MCG tablet Commonly known as: SYNTHROID Take 50 mcg by mouth daily before breakfast.   meloxicam 7.5 MG tablet Commonly known as: Mobic Take 1 tablet (7.5 mg total) by mouth daily as needed for pain. What changed:   when to take this  reasons to take this   psyllium 58.6 % packet Commonly known as: METAMUCIL Take 1 packet by mouth at bedtime.   vitamin C 500 MG tablet Commonly known as: ASCORBIC ACID Take 500-1,000 mg by mouth  daily.            Durable Medical Equipment  (From admission, onward)         Start     Ordered   Unscheduled  DME 3-in-1  Once     06/08/19 0941         Follow-up Information    [facility]. Schedule an appointment as soon as possible for a visit in 4 week(s).   Specialty: Neurology Contact information: [address] [place] [phone]       Kelton Pillar, MD Follow up in 1 week(s).   Specialty: Family Medicine Contact information: [address] [place] [address] [phone]          Allergies  Allergen Reactions  . Anesthetics, Ester Nausea Only  . Codeine Nausea Only  . Hydrocodone-Acetaminophen Other (See Comments)    Confusion  . Percocet [Oxycodone-Acetaminophen] Nausea Only    Consultations:  Neuro  Procedures/Studies: MR ANGIO HEAD WO CONTRAST  Result Date: 06/07/2019 CLINICAL DATA:  Encephalopathy and speech difficulty. Left-sided neglect. EXAM: MRI HEAD WITHOUT CONTRAST MRA HEAD WITHOUT CONTRAST TECHNIQUE: Multiplanar, multiecho pulse sequences of the brain and surrounding structures were obtained without intravenous contrast. Angiographic images of the head were obtained using MRA technique without contrast. COMPARISON:  None. FINDINGS: MRI HEAD FINDINGS BRAIN: There is no acute infarct, acute hemorrhage or mass effect. The midline structures are normal. Multifocal white matter hyperintensity, most commonly due to chronic ischemic microangiopathy. The CSF spaces are normal for age, with no hydrocephalus. Susceptibility-sensitive sequences show no chronic microhemorrhage or superficial siderosis. SKULL AND UPPER CERVICAL SPINE: The visualized skull base, calvarium, upper cervical spine and extracranial soft tissues are normal. SINUSES/ORBITS: No fluid levels or advanced mucosal thickening. No mastoid or middle ear effusion. The orbits are normal. MRA HEAD FINDINGS  POSTERIOR CIRCULATION: --Basilar artery: Normal. --Posterior cerebral arteries: Normal. Both originate from the basilar artery. --Superior cerebellar arteries: Normal. --Inferior cerebellar arteries: Normal anterior and posterior inferior cerebellar arteries. ANTERIOR CIRCULATION: --Intracranial internal carotid arteries: Normal. --Anterior cerebral arteries: Normal. Both A1 segments are present. Patent anterior communicating artery. --Middle cerebral arteries: Normal. --Posterior communicating arteries: Present on the right, absent on the left. IMPRESSION: 1. No acute intracranial abnormality. 2. Chronic small vessel ischemia. 3. Normal intracranial MRA. Electronically Signed   By: Ulyses Jarred M.D.   On: 06/07/2019 04:28   MR BRAIN WO CONTRAST  Result Date: 06/07/2019 CLINICAL DATA:  Encephalopathy and speech difficulty. Left-sided neglect. EXAM: MRI HEAD WITHOUT CONTRAST MRA HEAD WITHOUT CONTRAST TECHNIQUE: Multiplanar, multiecho pulse sequences of the brain and surrounding structures were obtained without intravenous contrast. Angiographic images of the head were obtained using MRA technique without contrast.  COMPARISON:  None. FINDINGS: MRI HEAD FINDINGS BRAIN: There is no acute infarct, acute hemorrhage or mass effect. The midline structures are normal. Multifocal white matter hyperintensity, most commonly due to chronic ischemic microangiopathy. The CSF spaces are normal for age, with no hydrocephalus. Susceptibility-sensitive sequences show no chronic microhemorrhage or superficial siderosis. SKULL AND UPPER CERVICAL SPINE: The visualized skull base, calvarium, upper cervical spine and extracranial soft tissues are normal. SINUSES/ORBITS: No fluid levels or advanced mucosal thickening. No mastoid or middle ear effusion. The orbits are normal. MRA HEAD FINDINGS POSTERIOR CIRCULATION: --Basilar artery: Normal. --Posterior cerebral arteries: Normal. Both originate from the basilar artery. --Superior  cerebellar arteries: Normal. --Inferior cerebellar arteries: Normal anterior and posterior inferior cerebellar arteries. ANTERIOR CIRCULATION: --Intracranial internal carotid arteries: Normal. --Anterior cerebral arteries: Normal. Both A1 segments are present. Patent anterior communicating artery. --Middle cerebral arteries: Normal. --Posterior communicating arteries: Present on the right, absent on the left. IMPRESSION: 1. No acute intracranial abnormality. 2. Chronic small vessel ischemia. 3. Normal intracranial MRA. Electronically Signed   By: Ulyses Jarred M.D.   On: 06/07/2019 04:28   ECHOCARDIOGRAM COMPLETE  Result Date: 06/07/2019   ECHOCARDIOGRAM REPORT   Patient Name:   Sarah Fletcher Date of Exam: 06/07/2019 Medical Rec #:  [id number]    Height:       60.0 in Accession #:    [id number]   Weight:       142.2 lb Date of Birth:  May 09, 1930   BSA:          1.61 m Patient Age:    85 years     BP:           152/83 mmHg Patient Gender: F            HR:           71 bpm. Exam Location:  Inpatient Procedure: 2D Echo, Cardiac Doppler and Color Doppler Indications:    TIA 435.9 / G45.9  History:        Patient has no prior history of Echocardiogram examinations.                 Hypothyroidism.  Sonographer:    Jonelle Sidle Dance Referring Phys: Celoron  1. Left ventricular ejection fraction, by visual estimation, is 55 to 60%. The left ventricle has normal function. There is no left ventricular hypertrophy.  2. Left ventricular diastolic parameters are indeterminate.  3. The left ventricle has no regional wall motion abnormalities.  4. Global right ventricle has normal systolic function.The right ventricular size is normal. No increase in right ventricular wall thickness.  5. Left atrial size was normal.  6. Right atrial size was normal.  7. Small pericardial effusion.  8. The pericardial effusion is circumferential.  9. The mitral valve is normal in structure. No evidence of mitral valve  regurgitation. No evidence of mitral stenosis. 10. The tricuspid valve is normal in structure. Tricuspid valve regurgitation is mild. 11. The aortic valve is tricuspid. Aortic valve regurgitation is not visualized. No evidence of aortic valve sclerosis or stenosis. 12. The pulmonic valve was not well visualized. Pulmonic valve regurgitation is not visualized. 13. Normal pulmonary artery systolic pressure. 14. The inferior vena cava is normal in size with greater than 50% respiratory variability, suggesting right atrial pressure of 3 mmHg. FINDINGS  Left Ventricle: Left ventricular ejection fraction, by visual estimation, is 55 to 60%. The left ventricle has normal function. The left ventricle has no regional wall motion abnormalities.  There is no left ventricular hypertrophy. Left ventricular diastolic parameters are indeterminate. Right Ventricle: The right ventricular size is normal. No increase in right ventricular wall thickness. Global RV systolic function is has normal systolic function. The tricuspid regurgitant velocity is 2.25 m/s, and with an assumed right atrial pressure  of 3 mmHg, the estimated right ventricular systolic pressure is normal at 23.2 mmHg. Left Atrium: Left atrial size was normal in size. Right Atrium: Right atrial size was normal in size Pericardium: A small pericardial effusion is present. The pericardial effusion is circumferential. Mitral Valve: The mitral valve is normal in structure. No evidence of mitral valve regurgitation. No evidence of mitral valve stenosis by observation. Tricuspid Valve: The tricuspid valve is normal in structure. Tricuspid valve regurgitation is mild. Aortic Valve: The aortic valve is tricuspid. Aortic valve regurgitation is not visualized. The aortic valve is structurally normal, with no evidence of sclerosis or stenosis. Pulmonic Valve: The pulmonic valve was not well visualized. Pulmonic valve regurgitation is not visualized. Pulmonic regurgitation is not  visualized. No evidence of pulmonic stenosis. Aorta: The aortic root is normal in size and structure. Pulmonary Artery: No pulmonary hypertension, PASP is 28 mmHg. Venous: The inferior vena cava is normal in size with greater than 50% respiratory variability, suggesting right atrial pressure of 3 mmHg. IAS/Shunts: No atrial level shunt detected by color flow Doppler.  LEFT VENTRICLE PLAX 2D LVIDd:         4.05 cm  Diastology LVIDs:         3.12 cm  LV e' lateral:   5.22 cm/s LV PW:         1.11 cm  LV E/e' lateral: 13.2 LV IVS:        0.85 cm  LV e' medial:    5.87 cm/s LVOT diam:     1.90 cm  LV E/e' medial:  11.8 LV SV:         34 ml LV SV Index:   20.07 LVOT Area:     2.84 cm  RIGHT VENTRICLE             IVC RV Basal diam:  2.51 cm     IVC diam: 1.73 cm RV S prime:     10.90 cm/s TAPSE (M-mode): 1.6 cm LEFT ATRIUM             Index       RIGHT ATRIUM          Index LA diam:        3.40 cm 2.11 cm/m  RA Area:     9.67 cm LA Vol (A2C):   27.4 ml 16.97 ml/m RA Volume:   17.90 ml 11.09 ml/m LA Vol (A4C):   27.4 ml 16.97 ml/m LA Biplane Vol: 28.8 ml 17.84 ml/m  AORTIC VALVE LVOT Vmax:   117.00 cm/s LVOT Vmean:  83.100 cm/s LVOT VTI:    0.248 m  AORTA Ao Root diam: 3.20 cm Ao Asc diam:  2.90 cm MITRAL VALVE                        TRICUSPID VALVE MV Area (PHT): 2.95 cm             TR Peak grad:   20.2 mmHg MV PHT:        74.53 msec           TR Vmax:        252.00 cm/s MV Decel Time:  257 msec MV E velocity: 69.00 cm/s 103 cm/s  SHUNTS MV A velocity: 74.10 cm/s 70.3 cm/s Systemic VTI:  0.25 m MV E/A ratio:  0.93       1.5       Systemic Diam: 1.90 cm  Carlyle Dolly MD Electronically signed by Carlyle Dolly MD Signature Date/Time: 06/07/2019/10:42:30 AM    Final    CT HEAD CODE STROKE WO CONTRAST  Result Date: 06/06/2019 CLINICAL DATA:  Code stroke.  Left-sided neglect. EXAM: CT HEAD WITHOUT CONTRAST TECHNIQUE: Contiguous axial images were obtained from the base of the skull through the vertex without  intravenous contrast. COMPARISON:  CT head 05/21/2018 FINDINGS: Brain: Negative for acute infarct, hemorrhage, mass Cerebral atrophy. Mild changes in the white matter and internal capsule bilaterally appear chronic. Vascular: Negative for hyperdense vessel Skull: Negative Sinuses/Orbits: Air-fluid level sphenoid sinus. Remaining sinuses clear. Bilateral cataract surgery. Other: None ASPECTS ([place] Stroke Program Early CT Score) - Ganglionic level infarction (caudate, lentiform nuclei, internal capsule, insula, M1-M3 cortex): 7 - Supraganglionic infarction (M4-M6 cortex): 3 Total score (0-10 with 10 being normal): 10 IMPRESSION: 1. No acute intracranial abnormality 2. ASPECTS is 10 3. Atrophy and chronic white matter ischemia 4. Air-fluid level sphenoid sinus. 5. Results texted to Dr. Cheral Marker Electronically Signed   By: Franchot Gallo M.D.   On: 06/06/2019 15:35   VAS US CAROTID (at [facility] and [facility] only)  Result Date: 06/07/2019 Carotid Arterial Duplex Study Indications:       TIA, Speech disturbance and Altered mental status. Comparison Study:  No prior study on file for comparison Performing Technologist: Sharion Dove RVS  Examination Guidelines: A complete evaluation includes B-mode imaging, spectral Doppler, color Doppler, and power Doppler as needed of all accessible portions of each vessel. Bilateral testing is considered an integral part of a complete examination. Limited examinations for reoccurring indications may be performed as noted.  Right Carotid Findings: +----------+--------+--------+--------+------------------+------------------+           PSV cm/sEDV cm/sStenosisPlaque DescriptionComments           +----------+--------+--------+--------+------------------+------------------+ CCA Prox  86      21                                intimal thickening +----------+--------+--------+--------+------------------+------------------+ CCA Distal63      13                                 intimal thickening +----------+--------+--------+--------+------------------+------------------+ ICA Prox  59      16              homogeneous                          +----------+--------+--------+--------+------------------+------------------+ ICA Distal60      17                                tortuous           +----------+--------+--------+--------+------------------+------------------+ ECA       83      14                                                   +----------+--------+--------+--------+------------------+------------------+ +----------+--------+-------+--------+-------------------+  PSV cm/sEDV cmsDescribeArm Pressure (mmHG) +----------+--------+-------+--------+-------------------+ [id number]                                         +----------+--------+-------+--------+-------------------+ +---------+--------+--+--------+--+ VertebralPSV cm/s37EDV cm/s13 +---------+--------+--+--------+--+  Left Carotid Findings: +----------+--------+--------+--------+------------------+------------------+           PSV cm/sEDV cm/sStenosisPlaque DescriptionComments           +----------+--------+--------+--------+------------------+------------------+ CCA Prox  83      23                                intimal thickening +----------+--------+--------+--------+------------------+------------------+ CCA Distal88      24                                intimal thickening +----------+--------+--------+--------+------------------+------------------+ ICA Prox  72      26                                                   +----------+--------+--------+--------+------------------+------------------+ ICA Distal41      15                                                   +----------+--------+--------+--------+------------------+------------------+ ECA       65      11                                                    +----------+--------+--------+--------+------------------+------------------+ +----------+--------+--------+--------+-------------------+           PSV cm/sEDV cm/sDescribeArm Pressure (mmHG) +----------+--------+--------+--------+-------------------+ [id number]                                          +----------+--------+--------+--------+-------------------+ +---------+--------+--+--------+--+ VertebralPSV cm/s51EDV cm/s17 +---------+--------+--+--------+--+  Summary: Right Carotid: The extracranial vessels were near-normal with only minimal wall                thickening or plaque. Left Carotid: The extracranial vessels were near-normal with only minimal wall               thickening or plaque. Vertebrals:  Bilateral vertebral arteries demonstrate antegrade flow. Subclavians: Normal flow hemodynamics were seen in bilateral subclavian              arteries. *See table(s) above for measurements and observations.     Preliminary    Subjective: Resting well, no complaints, no headache no numbness tingling nonfocal moving well.  Agreeable for return to [facility] today.  Discharge Exam: Vitals:   06/08/19 0600 06/08/19 0739  BP:  128/87  Pulse:  73  Resp: 15 18  Temp:  98.1 F (36.7 C)  SpO2:  97%   Vitals:   06/08/19 0431 06/08/19 0500 06/08/19 0600 06/08/19 0739  BP: 134/74   128/87  Pulse:  64   73  Resp: '19 15 15 18  '$ Temp: 97.6 F (36.4 C)   98.1 F (36.7 C)  TempSrc: Oral   Oral  SpO2: 97%   97%  Weight:      Height:        General: Pt is alert, awake, not in acute distress Cardiovascular: RRR, S1/S2 +, no rubs, no gallops Respiratory: CTA bilaterally, no wheezing, no rhonchi Abdominal: Soft, NT, ND, bowel sounds + Extremities: no edema, no cyanosis   The results of significant diagnostics from this hospitalization (including imaging, microbiology, ancillary and laboratory) are listed below for reference.     Microbiology: Recent Results (from the  past 240 hour(s))  SARS CORONAVIRUS 2 (TAT 6-24 HRS) Nasopharyngeal Nasopharyngeal Swab     Status: None   Collection Time: 06/06/19  8:13 PM   Specimen: Nasopharyngeal Swab  Result Value Ref Range Status   SARS Coronavirus 2 NEGATIVE NEGATIVE Final    Comment: (NOTE) SARS-CoV-2 target nucleic acids are NOT DETECTED. The SARS-CoV-2 RNA is generally detectable in upper and lower respiratory specimens during the acute phase of infection. Negative results do not preclude SARS-CoV-2 infection, do not rule out co-infections with other pathogens, and should not be used as the sole basis for treatment or other patient management decisions. Negative results must be combined with clinical observations, patient history, and epidemiological information. The expected result is Negative. Fact Sheet for Patients: SugarRoll.be Fact Sheet for Healthcare Providers: https://www.woods-mathews.com/ This test is not yet approved or cleared by the [place] FDA and  has been authorized for detection and/or diagnosis of SARS-CoV-2 by FDA under an Emergency Use Authorization (EUA). This EUA will remain  in effect (meaning this test can be used) for the duration of the COVID-19 declaration under Section 56 4(b)(1) of the Act, 21 U.S.C. section 360bbb-3(b)(1), unless the authorization is terminated or revoked sooner. Performed at [facility], [place] [address], [place], [place] [address]      Labs: BNP (last 3 results) No results for input(s): BNP in the last 8760 hours. Basic Metabolic Panel: Recent Labs  Lab 06/06/19 1525 06/06/19 1528  NA 135 136  K 3.7 3.8  CL 102 101  CO2  --  22  GLUCOSE 113* 120*  BUN 19 16  CREATININE 0.90 1.11*  CALCIUM  --  9.6   Liver Function Tests: Recent Labs  Lab 06/06/19 1528  AST 31  ALT 23  ALKPHOS 47  BILITOT 0.7  PROT 6.7  ALBUMIN 3.9   No results for input(s): LIPASE, AMYLASE in the last 168  hours. No results for input(s): AMMONIA in the last 168 hours. CBC: Recent Labs  Lab 06/06/19 1525 06/06/19 1528  WBC  --  9.3  NEUTROABS  --  6.5  HGB 15.3* 14.5  HCT 45.0 44.6  MCV  --  95.3  PLT  --  349   Cardiac Enzymes: No results for input(s): CKTOTAL, CKMB, CKMBINDEX, TROPONINI in the last 168 hours. BNP: Invalid input(s): POCBNP CBG: Recent Labs  Lab 06/06/19 1520  GLUCAP 107*   D-Dimer No results for input(s): DDIMER in the last 72 hours. Hgb A1c Recent Labs    06/07/19 0252  HGBA1C 6.0*   Lipid Profile Recent Labs    06/07/19 0252  CHOL 271*  HDL 106  LDLCALC 155*  TRIG 51  CHOLHDL 2.6   Thyroid function studies No results for input(s): TSH, T4TOTAL, T3FREE, THYROIDAB in the last 72 hours.  Invalid input(s):  FREET3 Anemia work up No results for input(s): VITAMINB12, FOLATE, FERRITIN, TIBC, IRON, RETICCTPCT in the last 72 hours. Urinalysis    Component Value Date/Time   COLORURINE ORANGE (A) 07/20/2014 0350   APPEARANCEUR CLEAR 07/20/2014 0350   LABSPEC 1.020 07/20/2014 0350   PHURINE 7.0 07/20/2014 0350   GLUCOSEU NEGATIVE 07/20/2014 0350   HGBUR NEGATIVE 07/20/2014 0350   BILIRUBINUR SMALL (A) 07/20/2014 0350   KETONESUR 40 (A) 07/20/2014 0350   PROTEINUR NEGATIVE 07/20/2014 0350   UROBILINOGEN 1.0 07/20/2014 0350   NITRITE POSITIVE (A) 07/20/2014 0350   LEUKOCYTESUR SMALL (A) 07/20/2014 0350   Sepsis Labs Invalid input(s): PROCALCITONIN,  WBC,  LACTICIDVEN Microbiology Recent Results (from the past 240 hour(s))  SARS CORONAVIRUS 2 (TAT 6-24 HRS) Nasopharyngeal Nasopharyngeal Swab     Status: None   Collection Time: 06/06/19  8:13 PM   Specimen: Nasopharyngeal Swab  Result Value Ref Range Status   SARS Coronavirus 2 NEGATIVE NEGATIVE Final    Comment: (NOTE) SARS-CoV-2 target nucleic acids are NOT DETECTED. The SARS-CoV-2 RNA is generally detectable in upper and lower respiratory specimens during the acute phase of infection.  Negative results do not preclude SARS-CoV-2 infection, do not rule out co-infections with other pathogens, and should not be used as the sole basis for treatment or other patient management decisions. Negative results must be combined with clinical observations, patient history, and epidemiological information. The expected result is Negative. Fact Sheet for Patients: SugarRoll.be Fact Sheet for Healthcare Providers: https://www.woods-mathews.com/ This test is not yet approved or cleared by the [place] FDA and  has been authorized for detection and/or diagnosis of SARS-CoV-2 by FDA under an Emergency Use Authorization (EUA). This EUA will remain  in effect (meaning this test can be used) for the duration of the COVID-19 declaration under Section 56 4(b)(1) of the Act, 21 U.S.C. section 360bbb-3(b)(1), unless the authorization is terminated or revoked sooner. Performed at [facility], [place] [address] [place], [place] [address]      Time coordinating discharge: 25 minutes  SIGNED:   Antonieta Pert, MD  [facility] 06/08/2019, 9:41 AM  If 7PM-7AM, please contact night-coverage [email or web address]

## 2019-07-16 ENCOUNTER — Encounter: Payer: Self-pay | Admitting: Adult Health

## 2019-07-16 ENCOUNTER — Ambulatory Visit (INDEPENDENT_AMBULATORY_CARE_PROVIDER_SITE_OTHER): Payer: Medicare Other | Admitting: Adult Health

## 2019-07-16 ENCOUNTER — Other Ambulatory Visit: Payer: Self-pay

## 2019-07-16 VITALS — BP 116/74 | HR 75 | Temp 97.1°F | Ht 60.0 in | Wt 133.4 lb

## 2019-07-16 DIAGNOSIS — I10 Essential (primary) hypertension: Secondary | ICD-10-CM

## 2019-07-16 DIAGNOSIS — G44229 Chronic tension-type headache, not intractable: Secondary | ICD-10-CM

## 2019-07-16 DIAGNOSIS — E785 Hyperlipidemia, unspecified: Secondary | ICD-10-CM | POA: Diagnosis not present

## 2019-07-16 DIAGNOSIS — R2990 Unspecified symptoms and signs involving the nervous system: Secondary | ICD-10-CM

## 2019-07-16 NOTE — Progress Notes (Signed)
[facility] [address] [place]. [place] [address] [phone]       HOSPITAL FOLLOW UP NOTE  Ms. Sarah Fletcher Date of Birth:  [birth year] Medical Record Number:  [id number]   Reason for Referral:  hospital stroke follow up    CHIEF COMPLAINT:  Chief Complaint  Patient presents with  . Follow-up    RM9. with daughter in law. States that she doesnt feel like herself. Always feels as if she has a light case of the flu. Has frequent headaches.    HPI: Sarah Fletcher being seen today for in office hospital follow-up regarding ocular migraine versus right amaurosis fugax on 06/06/2019 history obtained from patient, daughter and chart review. Reviewed all radiology images and labs personally.  Ms. Sarah Fletcher is a 86 y.o. female with history of arthritis and hypothyroidism  presented on 06/06/2019 with acute onset of blurred vision and possible slurred speech. She did not receive IV t-PA due to risks outweighed benefits.  Evaluated by Dr. Erlinda Hong and stroke team with stroke work-up completed and symptoms likely due to ocular migraine versus right amaurosis fugax.  CT head and MRI head no acute abnormality.  MRA head and carotid Dopplers unremarkable.  2D echo showed an EF of 55 to 60%. SARS coronavirus 2 negative.  Not previously on antithrombotic and recommended DAPT for 3 weeks and aspirin alone.  Complaints of mild frontal headaches for the past several months without migrainous symptoms and felt likely due to tension headache related to depressed mood in ALF with confinement due to Covid policy.  Did not feel as though symptoms related to temporal arteritis as no jaw claudication and ESR and CRP normal.  HTN stable.  LDL 155 and initiated atorvastatin 40 mg daily.  Other stroke risk factors include advanced age and EtOH use but no prior history of stroke.  Discharge back to ALF in stable condition.  Sarah Fletcher is a 86 year old female who is being seen today for hospital  follow-up accompanied by her daughter.  She has been doing well since discharge without any reoccurring visual difficulties.  She has continued to experience frontal headache also endorses increased depression/anxiety.  Recently increase fluoxetine dosage to 20 mg daily by PCP along with continuation of Xanax as needed.  She does endorse insomnia, nocturia, daytime fatigue and morning headaches.  She states upon awakening, she feels fatigued, generally does not feel well and headaches which improved throughout the day.  She has not previously underwent sleep study.  She has continued on aspirin and Plavix without bleeding or bruising despite 3-week recommendation.  She has continued on atorvastatin without myalgias.  Blood pressure today 116/74.  Denies new or worsening stroke/TIA symptoms.    ROS:   14 system review of systems performed and negative with exception of headaches, depression and anxiety  PMH:  Past Medical History:  Diagnosis Date  . Arthritis   . Hypothyroid   . Osteopenia     PSH:  Past Surgical History:  Procedure Laterality Date  . ABDOMINAL HYSTERECTOMY    . OTHER SURGICAL HISTORY     multiple ortho injuries fx   . REPLACEMENT TOTAL KNEE    . TONSILLECTOMY      Social History:  Social History   Socioeconomic History  . Marital status: Widowed    Spouse name: Not on file  . Number of children: Not on file  . Years of education: Not on file  . Highest education level: Not on file  Occupational History  . Not on file  Tobacco Use  . Smoking status: Never Smoker  . Smokeless tobacco: Never Used  Substance and Sexual Activity  . Alcohol use: Yes    Comment: occasional   . Drug use: Not on file  . Sexual activity: Not on file  Other Topics Concern  . Not on file  Social History Narrative  . Not on file   Social Determinants of Health   Financial Resource Strain:   . Difficulty of Paying Living Expenses: Not on file  Food Insecurity:   . Worried  About Charity fundraiser in the Last Year: Not on file  . Ran Out of Food in the Last Year: Not on file  Transportation Needs:   . Lack of Transportation (Medical): Not on file  . Lack of Transportation (Non-Medical): Not on file  Physical Activity:   . Days of Exercise per Week: Not on file  . Minutes of Exercise per Session: Not on file  Stress:   . Feeling of Stress : Not on file  Social Connections:   . Frequency of Communication with Friends and Family: Not on file  . Frequency of Social Gatherings with Friends and Family: Not on file  . Attends Religious Services: Not on file  . Active Member of Clubs or Organizations: Not on file  . Attends Archivist Meetings: Not on file  . Marital Status: Not on file  Intimate Partner Violence:   . Fear of Current or Ex-Partner: Not on file  . Emotionally Abused: Not on file  . Physically Abused: Not on file  . Sexually Abused: Not on file    Family History: No family history on file.  Medications:   Current Outpatient Medications on File Prior to Visit  Medication Sig Dispense Refill  . acetaminophen (TYLENOL) 325 MG tablet Take 2 tablets (650 mg total) by mouth every 4 (four) hours as needed for mild pain (or temp > 37.5 C (99.5 F)). 40 tablet 0  . ALPRAZolam (XANAX) 0.25 MG tablet Take 0.25 mg by mouth 2 (two) times daily as needed.    [name] aspirin 81 MG chewable tablet Chew 1 tablet (81 mg total) by mouth daily. 30 tablet 1  . atorvastatin (LIPITOR) 40 MG tablet Take 1 tablet (40 mg total) by mouth daily at 6 PM. 30 tablet 1  . denosumab (PROLIA) 60 MG/ML SOLN injection Inject 60 mg into the skin every 6 (six) months. Administer in upper arm, thigh, or abdomen    . DOK 100 MG capsule Take 100 mg by mouth 2 (two) times daily.    [name] FLUoxetine (PROZAC) 10 MG tablet Take 20 mg by mouth daily.    [name] levothyroxine (SYNTHROID, LEVOTHROID) 50 MCG tablet Take 50 mcg by mouth daily before breakfast.    . UNABLE TO FIND Med Name:  Viactive    . vitamin C (ASCORBIC ACID) 500 MG tablet Take 500-1,000 mg by mouth daily.    . psyllium (METAMUCIL) 58.6 % packet Take 1 packet by mouth at bedtime.     No current facility-administered medications on file prior to visit.    Allergies:   Allergies  Allergen Reactions  . Anesthetics, Ester Nausea Only  . Codeine Nausea Only  . Hydrocodone-Acetaminophen Other (See Comments)    Confusion  . Percocet [Oxycodone-Acetaminophen] Nausea Only     Physical Exam  Vitals:   07/16/19 1406  BP: 116/74  Pulse: 75  Temp: (!) 97.1 F (36.2 C)  Weight: 133 lb 6.4 oz (60.5 kg)  Height: 5' (1.524 m)   Body mass index is 26.05 kg/m. No exam data present  No flowsheet data found.   General: well developed, well nourished,  pleasant elderly Caucasian female, seated, in no evident distress Head: head normocephalic and atraumatic.   Neck: supple with no carotid or supraclavicular bruits Cardiovascular: regular rate and rhythm, no murmurs Musculoskeletal: no deformity Skin:  no rash/petichiae Vascular:  Normal pulses all extremities   Neurologic Exam Mental Status: Awake and fully alert.   Normal speech and language.  Oriented to place and time. Recent and remote memory intact. Attention span, concentration and fund of knowledge appropriate. Mood and affect appropriate.  Cranial Nerves: Fundoscopic exam reveals sharp disc margins. Pupils equal, briskly reactive to light. Extraocular movements full without nystagmus. Visual fields full to confrontation. Hearing intact. Facial sensation intact. Face, tongue, palate moves normally and symmetrically.  Motor: Normal bulk and tone. Normal strength in all tested extremity muscles. Sensory.: intact to touch , pinprick , position and vibratory sensation.  Coordination: Rapid alternating movements normal in all extremities. Finger-to-nose and heel-to-shin performed accurately bilaterally. Gait and Station: Arises from chair without  difficulty. Stance is normal. Gait demonstrates normal stride length and balance with use of rolling walker Reflexes: 1+ and symmetric. Toes downgoing.     NIHSS  0 Modified Rankin  0    Diagnostic Data (Labs, Imaging, Testing)  MR ANGIO HEAD WO CONTRAST 06/07/2019 IMPRESSION:  1. No acute intracranial abnormality.  2. Chronic small vessel ischemia.  3. Normal intracranial MRA.   MR BRAIN WO CONTRAST 06/07/2019  IMPRESSION:  1. No acute intracranial abnormality.  2. Chronic small vessel ischemia.  3. Normal intracranial MRA.   CT HEAD CODE STROKE WO CONTRAST 06/06/2019 IMPRESSION:  1. No acute intracranial abnormality  2. ASPECTS is 10 3. Atrophy and chronic white matter ischemia  4. Air-fluid level sphenoid sinus.   Transthoracic Echocardiogram  06/07/2019 IMPRESSIONS 1. Left ventricular ejection fraction, by visual estimation, is 55 to 60%. The left ventricle has normal function. There is no left ventricular hypertrophy. 2. Left ventricular diastolic parameters are indeterminate. 3. The left ventricle has no regional wall motion abnormalities. 4. Global right ventricle has normal systolic function.The right ventricular size is normal. No increase in right ventricular wall thickness. 5. Left atrial size was normal. 6. Right atrial size was normal. 7. Small pericardial effusion. 8. The pericardial effusion is circumferential. 9. The mitral valve is normal in structure. No evidence of mitral valve regurgitation. No evidence of mitral stenosis. 10. The tricuspid valve is normal in structure. Tricuspid valve regurgitation is mild. 11. The aortic valve is tricuspid. Aortic valve regurgitation is not visualized. No evidence of aortic valve sclerosis or stenosis. 12. The pulmonic valve was not well visualized. Pulmonic valve regurgitation is not visualized. 13. Normal pulmonary artery systolic pressure. 14. The inferior vena cava is normal in size with greater  than 50% respiratory variability, suggesting right atrial pressure of 3 mmHg.    ASSESSMENT: Sarah Fletcher is a 86 y.o. year old female presented with acute onset of blurred vision and possible slurred speech on 06/06/2019 with symptoms likely related to ocular migraine vs right amaurosis fugax. Vascular risk factors include HTN, HLD, advanced age and EtOH use.  She has not had reoccurring symptoms but does continue to experience daily frontal headache but typically present upon awakening and question of possible sleep apnea    PLAN:  1. Strokelike symptoms: Continue aspirin  81 mg daily  and Lipitor for secondary stroke prevention.  Discontinue Plavix at this time and continue on aspirin alone as prolonged DAPT not indicated.  Maintain strict control of hypertension with blood pressure goal below 130/90, diabetes with hemoglobin A1c goal below 6.5% and cholesterol with LDL cholesterol (bad cholesterol) goal below 70 mg/dL.  I also advised the patient to eat a healthy diet with plenty of whole grains, cereals, fruits and vegetables, exercise regularly with at least 30 minutes of continuous activity daily and maintain ideal body weight. 2. HTN: Advised to continue current treatment regimen.  Today's BP stable.  Advised to continue to monitor at home along with continued follow-up with PCP for management 3. HLD: Advised to continue current treatment regimen along with continued follow-up with PCP for future prescribing and monitoring of lipid panel 4. Headaches: Potentially related to increased stress with underlying depression/anxiety or possibly related to underlying sleep apnea.  She would like to hold off at this time regarding possible sleep apnea testing but she will discuss this further with her daughter and they will call office if interested in pursuing.  Discussion regarding increased risks of untreated sleep apnea and she verbalized understanding.  Advised to continue to follow with PCP for  ongoing monitoring management of depression/anxiety.       Follow up in 3 months or call earlier if needed   Greater than 50% of time during this prolonged 45 minute visit was spent on counseling, explanation of diagnosis of strokelike symptoms, reviewing risk factor management of HTN and HLD, discussion regarding increased stress and ongoing depression/anxiety with ongoing management by PCP, discussion regarding potential underlying sleep apnea, planning of further management along with potential future management, and discussion with patient and family answering all questions.    Frann Rider, AGNP-BC  [facility] [address] [place], [place] [address]  Phone [phone] Fax [phone] Note: This document was prepared with digital dictation and possible smart phrase technology. Any transcriptional errors that result from this process are unintentional.

## 2019-07-16 NOTE — Patient Instructions (Addendum)
Continue aspirin 81 mg daily  and lipitor  for secondary stroke prevention  Discontinue plavix at this time and continue aspirin alone  Continue to follow up with PCP regarding cholesterol and blood pressure management   Highly recommend pursuing sleep study - please call office if you would like to pursue further evaluation  Continue to follow with your primary care provider for ongoing management of depression and anxiety  Continue to monitor blood pressure at home  Maintain strict control of hypertension with blood pressure goal below 130/90, diabetes with hemoglobin A1c goal below 6.5% and cholesterol with LDL cholesterol (bad cholesterol) goal below 70 mg/dL. I also advised the patient to eat a healthy diet with plenty of whole grains, cereals, fruits and vegetables, exercise regularly and maintain ideal body weight.  Followup in the future with me in 3 months or call earlier if needed       Thank you for coming to see [place] at [facility]. I hope we have been able to provide you high quality care today.  You may receive a patient satisfaction survey over the next few weeks. We would appreciate your feedback and comments so that we may continue to improve ourselves and the health of our patients.     Sleep Apnea Sleep apnea affects breathing during sleep. It causes breathing to stop for a short time or to become shallow. It can also increase the risk of:  Heart attack.  Stroke.  Being very overweight (obese).  Diabetes.  Heart failure.  Irregular heartbeat. The goal of treatment is to help you breathe normally again. What are the causes? There are three kinds of sleep apnea:  Obstructive sleep apnea. This is caused by a blocked or collapsed airway.  Central sleep apnea. This happens when the brain does not send the right signals to the muscles that control breathing.  Mixed sleep apnea. This is a combination of obstructive and central sleep  apnea. The most common cause of this condition is a collapsed or blocked airway. This can happen if:  Your throat muscles are too relaxed.  Your tongue and tonsils are too large.  You are overweight.  Your airway is too small. What increases the risk?  Being overweight.  Smoking.  Having a small airway.  Being older.  Being female.  Drinking alcohol.  Taking medicines to calm yourself (sedatives or tranquilizers).  Having family members with the condition. What are the signs or symptoms?  Trouble staying asleep.  Being sleepy or tired during the day.  Getting angry a lot.  Loud snoring.  Headaches in the morning.  Not being able to focus your mind (concentrate).  Forgetting things.  Less interest in sex.  Mood swings.  Personality changes.  Feelings of sadness (depression).  Waking up a lot during the night to pee (urinate).  Dry mouth.  Sore throat. How is this diagnosed?  Your medical history.  A physical exam.  A test that is done when you are sleeping (sleep study). The test is most often done in a sleep lab but may also be done at home. How is this treated?   Sleeping on your side.  Using a medicine to get rid of mucus in your nose (decongestant).  Avoiding the use of alcohol, medicines to help you relax, or certain pain medicines (narcotics).  Losing weight, if needed.  Changing your diet.  Not smoking.  Using a machine to open your airway while you sleep, such as: ? An oral  appliance. This is a mouthpiece that shifts your lower jaw forward. ? A CPAP device. This device blows air through a mask when you breathe out (exhale). ? An EPAP device. This has valves that you put in each nostril. ? A BPAP device. This device blows air through a mask when you breathe in (inhale) and breathe out.  Having surgery if other treatments do not work. It is important to get treatment for sleep apnea. Without treatment, it can lead to:  High  blood pressure.  Coronary artery disease.  In men, not being able to have an erection (impotence).  Reduced thinking ability. Follow these instructions at home: Lifestyle  Make changes that your doctor recommends.  Eat a healthy diet.  Lose weight if needed.  Avoid alcohol, medicines to help you relax, and some pain medicines.  Do not use any products that contain nicotine or tobacco, such as cigarettes, e-cigarettes, and chewing tobacco. If you need help quitting, ask your doctor. General instructions  Take over-the-counter and prescription medicines only as told by your doctor.  If you were given a machine to use while you sleep, use it only as told by your doctor.  If you are having surgery, make sure to tell your doctor you have sleep apnea. You may need to bring your device with you.  Keep all follow-up visits as told by your doctor. This is important. Contact a doctor if:  The machine that you were given to use during sleep bothers you or does not seem to be working.  You do not get better.  You get worse. Get help right away if:  Your chest hurts.  You have trouble breathing in enough air.  You have an uncomfortable feeling in your back, arms, or stomach.  You have trouble talking.  One side of your body feels weak.  A part of your face is hanging down. These symptoms may be an emergency. Do not wait to see if the symptoms will go away. Get medical help right away. Call your local emergency services (911 in the [place] Do not drive yourself to the hospital. Summary  This condition affects breathing during sleep.  The most common cause is a collapsed or blocked airway.  The goal of treatment is to help you breathe normally while you sleep. This information is not intended to replace advice given to you by your health care provider. Make sure you discuss any questions you have with your health care provider. Document Revised: 03/28/2018 Document Reviewed:  02/04/2018 Elsevier Patient Education  [place].

## 2019-07-17 NOTE — Progress Notes (Signed)
I agree with the above plan 

## 2019-10-15 ENCOUNTER — Other Ambulatory Visit: Payer: Self-pay

## 2019-10-15 ENCOUNTER — Ambulatory Visit (INDEPENDENT_AMBULATORY_CARE_PROVIDER_SITE_OTHER): Payer: Medicare Other | Admitting: Adult Health

## 2019-10-15 ENCOUNTER — Encounter: Payer: Self-pay | Admitting: Adult Health

## 2019-10-15 VITALS — BP 118/72 | HR 74 | Temp 97.0°F | Ht 60.0 in | Wt 133.0 lb

## 2019-10-15 DIAGNOSIS — R4182 Altered mental status, unspecified: Secondary | ICD-10-CM | POA: Diagnosis not present

## 2019-10-15 DIAGNOSIS — I10 Essential (primary) hypertension: Secondary | ICD-10-CM | POA: Diagnosis not present

## 2019-10-15 DIAGNOSIS — E785 Hyperlipidemia, unspecified: Secondary | ICD-10-CM

## 2019-10-15 DIAGNOSIS — R2990 Unspecified symptoms and signs involving the nervous system: Secondary | ICD-10-CM | POA: Diagnosis not present

## 2019-10-15 MED ORDER — ASPIRIN EC 81 MG PO TBEC: 81.0000 mg | DELAYED_RELEASE_TABLET | Freq: Every day | ORAL | Status: AC

## 2019-10-15 NOTE — Patient Instructions (Addendum)
Your Plan:  We will check you urine today as well as lab work to look for reversible causes of memory concerns  Recommend decreasing klonopin to only nightly for the next week and then discontinue as this could be causing your symptoms. Would highly recommend avoidance of benzos especially on a daily use. Would recommend either increasing prozac or switching to SSRI such as Lexapro - will hold of on increasing or changes until memory improves     Follow up in 2 months or call earlier if needed      Thank you for coming to see [place] at [facility]. I hope we have been able to provide you high quality care today.  You may receive a patient satisfaction survey over the next few weeks. We would appreciate your feedback and comments so that we may continue to improve ourselves and the health of our patients.

## 2019-10-15 NOTE — Progress Notes (Signed)
[facility] [address] [place]. [place] [address] [phone]       STROKE FOLLOW UP NOTE  Ms. Sarah Fletcher Date of Birth:  10-27-1929 Medical Record Number:  [id number]   Reason for Referral: stroke follow up    CHIEF COMPLAINT:  Chief Complaint  Patient presents with  . Follow-up    hx of stroke, rm 9, alone, waiting on daughter     HPI:  Today, 10/15/2019, Ms. Sarah Fletcher returns for follow-up accompanied by her daughter.  She has not had any reoccurring transient visual episodes or impairments.  Daughter has concerns regarding 2 to 3-week onset of the increase memory loss and confusion such as forgetting appointments or schedule get-togethers with her friends which is not typical for her.  Was seen by PCP on 10/07/2018 with MMSE 26/30.  She does report recent change of Xanax to Klonopin approximately 3 weeks ago due to continued anxiety despite ongoing use of fluoxetine.  She reports taking Klonopin 0.5 mg twice daily in the morning and before bed.  She continues on aspirin 81 mg daily and atorvastatin 40 mg daily without side effects. Blood pressure today 118/72.  No further concerns at this time.     History provided for reference purposes only Initial visit 07/16/2019 JM: Ms. Sarah Fletcher is a 86 year old female who is being seen today for hospital follow-up accompanied by her daughter.  She has been doing well since discharge without any reoccurring visual difficulties.  She has continued to experience frontal headache also endorses increased depression/anxiety.  Recently increase fluoxetine dosage to 20 mg daily by PCP along with continuation of Xanax as needed.  She does endorse insomnia, nocturia, daytime fatigue and morning headaches.  She states upon awakening, she feels fatigued, generally does not feel well and headaches which improved throughout the day.  She has not previously underwent sleep study.  She has continued on aspirin and Plavix without bleeding or  bruising despite 3-week recommendation.  She has continued on atorvastatin without myalgias.  Blood pressure today 116/74.  Denies new or worsening stroke/TIA symptoms.  Stroke admission 06/06/2019: Ms. Sarah Fletcher is a 86 y.o. female with history of arthritis and hypothyroidism  presented on 06/06/2019 with acute onset of blurred vision and possible slurred speech. She did not receive IV t-PA due to risks outweighed benefits.  Evaluated by Dr. Erlinda Hong and stroke team with stroke work-up completed and symptoms likely due to ocular migraine versus right amaurosis fugax.  CT head and MRI head no acute abnormality.  MRA head and carotid Dopplers unremarkable.  2D echo showed an EF of 55 to 60%. SARS coronavirus 2 negative.  Not previously on antithrombotic and recommended DAPT for 3 weeks and aspirin alone.  Complaints of mild frontal headaches for the past several months without migrainous symptoms and felt likely due to tension headache related to depressed mood in ALF with confinement due to Covid policy.  Did not feel as though symptoms related to temporal arteritis as no jaw claudication and ESR and CRP normal.  HTN stable.  LDL 155 and initiated atorvastatin 40 mg daily.  Other stroke risk factors include advanced age and EtOH use but no prior history of stroke.  Discharge back to ALF in stable condition.      ROS:   14 system review of systems performed and negative with exception of memory loss and confusion   PMH:  Past Medical History:  Diagnosis Date  . Arthritis   . Hypothyroid   .  Osteopenia     PSH:  Past Surgical History:  Procedure Laterality Date  . ABDOMINAL HYSTERECTOMY    . OTHER SURGICAL HISTORY     multiple ortho injuries fx   . REPLACEMENT TOTAL KNEE    . TONSILLECTOMY      Social History:  Social History   Socioeconomic History  . Marital status: Widowed    Spouse name: Not on file  . Number of children: Not on file  . Years of education: Not on file  . Highest  education level: Not on file  Occupational History  . Not on file  Tobacco Use  . Smoking status: Never Smoker  . Smokeless tobacco: Never Used  Substance and Sexual Activity  . Alcohol use: Yes    Comment: occasional   . Drug use: Not on file  . Sexual activity: Not on file  Other Topics Concern  . Not on file  Social History Narrative  . Not on file   Social Determinants of Health   Financial Resource Strain:   . Difficulty of Paying Living Expenses:   Food Insecurity:   . Worried About Charity fundraiser in the Last Year:   . Arboriculturist in the Last Year:   Transportation Needs:   . Film/video editor (Medical):   [name] Lack of Transportation (Non-Medical):   Physical Activity:   . Days of Exercise per Week:   . Minutes of Exercise per Session:   Stress:   . Feeling of Stress :   Social Connections:   . Frequency of Communication with Friends and Family:   . Frequency of Social Gatherings with Friends and Family:   . Attends Religious Services:   . Active Member of Clubs or Organizations:   . Attends Archivist Meetings:   [name] Marital Status:   Intimate Partner Violence:   . Fear of Current or Ex-Partner:   . Emotionally Abused:   [name] Physically Abused:   . Sexually Abused:     Family History: No family history on file.  Medications:   Current Outpatient Medications on File Prior to Visit  Medication Sig Dispense Refill  . acetaminophen (TYLENOL) 325 MG tablet Take 2 tablets (650 mg total) by mouth every 4 (four) hours as needed for mild pain (or temp > 37.5 C (99.5 F)). 40 tablet 0  . clonazePAM (KLONOPIN) 0.5 MG tablet Take 0.5 mg by mouth daily as needed.    [name] denosumab (PROLIA) 60 MG/ML SOLN injection Inject 60 mg into the skin every 6 (six) months. Administer in upper arm, thigh, or abdomen    . DOK 100 MG capsule Take 100 mg by mouth 2 (two) times daily.    [name] FLUoxetine (PROZAC) 10 MG tablet Take 10 mg by mouth daily.    [name] levothyroxine  (SYNTHROID, LEVOTHROID) 50 MCG tablet Take 50 mcg by mouth daily before breakfast.    . psyllium (METAMUCIL) 58.6 % packet Take 1 packet by mouth at bedtime.    [name] UNABLE TO FIND Med Name: Viactive    . vitamin C (ASCORBIC ACID) 500 MG tablet Take 500-1,000 mg by mouth daily.    [name] atorvastatin (LIPITOR) 40 MG tablet Take 1 tablet (40 mg total) by mouth daily at 6 PM. 30 tablet 1   No current facility-administered medications on file prior to visit.    Allergies:   Allergies  Allergen Reactions  . Anesthetics, Ester Nausea Only  . Codeine Nausea Only  . Hydrocodone-Acetaminophen  Other (See Comments)    Confusion  . Percocet [Oxycodone-Acetaminophen] Nausea Only     Vitals  Vitals:   10/15/19 1242  BP: 118/72  Pulse: 74  Temp: (!) 97 F (36.1 C)  Weight: 133 lb (60.3 kg)  Height: 5' (1.524 m)   Body mass index is 25.97 kg/m. No exam data present  Physical exam  General: well developed, well nourished, pleasant elderly Caucasian female, seated, in no evident distress Head: head normocephalic and atraumatic.   Neck: supple with no carotid or supraclavicular bruits Cardiovascular: regular rate and rhythm, no murmurs Musculoskeletal: no deformity Skin:  no rash/petichiae Vascular:  Normal pulses all extremities   Neurologic Exam Mental Status: Awake and fully alert.   Normal speech and language.  Oriented to place and time. Recent memory diminished and remote memory intact. Attention span and concentration impaired frequently speaking off topic and repeating self and fund of knowledge mostly appropriate. Mood and affect appropriate.  Cranial Nerves: Pupils equal, briskly reactive to light. Extraocular movements full without nystagmus. Visual fields full to confrontation. Hearing intact. Facial sensation intact. Face, tongue, palate moves normally and symmetrically.  Motor: Normal bulk and tone. Normal strength in all tested extremity muscles. Sensory.: intact to touch ,  pinprick , position and vibratory sensation.  Coordination: Rapid alternating movements normal in all extremities. Finger-to-nose and heel-to-shin performed accurately bilaterally. Gait and Station: Arises from chair without difficulty. Stance is normal. Gait demonstrates normal stride length and balance with use of rolling walker Reflexes: 1+ and symmetric. Toes downgoing.       ASSESSMENT: Sarah Fletcher is a 86 y.o. year old female presented with acute onset of blurred vision and possible slurred speech on 06/06/2019 with symptoms likely related to ocular migraine vs right amaurosis fugax. Vascular risk factors include HTN, HLD, advanced age and EtOH use. She has not had any recurrent visual impairments. Recent onset of memory loss and confusion over the past 2-3 weeks.    PLAN:  1. Confusion/memory loss: 2-3 week onset - question acute infection vs medication related. Low suspicion for neurological cause as no other focal deficits or symptoms. Will obtain CMP, CBC and UA. Recommend decreasing klonopin dosage from BID to nightly over the next week and then completely discontinue for possible medication related. Advised any worsening or focal symptoms, call 911 immediately for further evaluation  2. Transient neurological symptoms: Continue aspirin 81 mg daily  and Lipitor for stroke prevention.  Maintain strict control of hypertension with blood pressure goal below 130/90, diabetes with hemoglobin A1c goal below 6.5% and cholesterol with LDL cholesterol (bad cholesterol) goal below 70 mg/dL.  I also advised the patient to eat a healthy diet with plenty of whole grains, cereals, fruits and vegetables, exercise regularly with at least 30 minutes of continuous activity daily and maintain ideal body weight. 3. HTN: stable. Continue to follow with PCP for monitoring and management 4. HLD: stable. Continue to follow with PCP for monitoring and management    Follow up in 2 months or call earlier if  needed   I spent 36 minutes of face-to-face and non-face-to-face time with patient and daughter.  This included previsit chart review, lab review, study review, order entry, electronic health record documentation, patient education regarding recent onset mental status change and possible etiologies, use of benzos and possible side effects and answered all questions to patient and daughters satisfaction      Frann Rider, AGNP-BC  [facility] [address] [place],  [place] [address]  Phone [phone] Fax [phone] Note: This document was prepared with digital dictation and possible smart phrase technology. Any transcriptional errors that result from this process are unintentional.

## 2019-10-16 LAB — COMPREHENSIVE METABOLIC PANEL
ALT: 116 IU/L — ABNORMAL HIGH (ref 0–32)
AST: 97 IU/L — ABNORMAL HIGH (ref 0–40)
Albumin/Globulin Ratio: 2 (ref 1.2–2.2)
Albumin: 4.4 g/dL (ref 3.6–4.6)
Alkaline Phosphatase: 163 IU/L — ABNORMAL HIGH (ref 39–117)
BUN/Creatinine Ratio: 23 (ref 12–28)
BUN: 21 mg/dL (ref 8–27)
Bilirubin Total: 0.3 mg/dL (ref 0.0–1.2)
CO2: 23 mmol/L (ref 20–29)
Calcium: 10.5 mg/dL — ABNORMAL HIGH (ref 8.7–10.3)
Chloride: 103 mmol/L (ref 96–106)
Creatinine, Ser: 0.93 mg/dL (ref 0.57–1.00)
GFR calc Af Amer: 64 mL/min/{1.73_m2} (ref >59)
GFR calc non Af Amer: 55 mL/min/{1.73_m2} — ABNORMAL LOW (ref >59)
Globulin, Total: 2.2 g/dL (ref 1.5–4.5)
Glucose: 83 mg/dL (ref 65–99)
Potassium: 4.9 mmol/L (ref 3.5–5.2)
Sodium: 142 mmol/L (ref 134–144)
Total Protein: 6.6 g/dL (ref 6.0–8.5)

## 2019-10-16 LAB — CBC WITH DIFFERENTIAL/PLATELET
Basophils Absolute: 0.1 10*3/uL (ref 0.0–0.2)
Basos: 1 %
EOS (ABSOLUTE): 0.4 10*3/uL (ref 0.0–0.4)
Eos: 6 %
Hematocrit: 42.4 % (ref 34.0–46.6)
Hemoglobin: 13.9 g/dL (ref 11.1–15.9)
Immature Grans (Abs): 0 10*3/uL (ref 0.0–0.1)
Immature Granulocytes: 0 %
Lymphocytes Absolute: 1.1 10*3/uL (ref 0.7–3.1)
Lymphs: 15 %
MCH: 30.4 pg (ref 26.6–33.0)
MCHC: 32.8 g/dL (ref 31.5–35.7)
MCV: 93 fL (ref 79–97)
Monocytes Absolute: 0.7 10*3/uL (ref 0.1–0.9)
Monocytes: 10 %
Neutrophils Absolute: 4.7 10*3/uL (ref 1.4–7.0)
Neutrophils: 68 %
Platelets: 355 10*3/uL (ref 150–450)
RBC: 4.57 x10E6/uL (ref 3.77–5.28)
RDW: 12 % (ref 11.7–15.4)
WBC: 7.1 10*3/uL (ref 3.4–10.8)

## 2019-10-16 LAB — URINALYSIS, ROUTINE W REFLEX MICROSCOPIC
Bilirubin, UA: NEGATIVE
Glucose, UA: NEGATIVE
Ketones, UA: NEGATIVE
Nitrite, UA: NEGATIVE
Protein,UA: NEGATIVE
RBC, UA: NEGATIVE
Specific Gravity, UA: 1.013 (ref 1.005–1.030)
Urobilinogen, Ur: 0.2 mg/dL (ref 0.2–1.0)
pH, UA: 6 (ref 5.0–7.5)

## 2019-10-16 LAB — MICROSCOPIC EXAMINATION
Bacteria, UA: NONE SEEN
Casts: NONE SEEN /lpf
Epithelial Cells (non renal): NONE SEEN /hpf (ref 0–10)
RBC, Urine: NONE SEEN /hpf (ref 0–2)

## 2019-10-17 ENCOUNTER — Encounter: Payer: Self-pay | Admitting: Adult Health

## 2019-10-17 ENCOUNTER — Telehealth: Payer: Self-pay | Admitting: Adult Health

## 2019-10-17 NOTE — Telephone Encounter (Signed)
Call patient's daughter, Cloyde Reams, in regards to recent lab work and urine in setting of acute memory loss and confusion.  UA overall unremarkable but CMP did show elevation of liver function panel.  Advised to continue decreasing Klonopin dosage taking once nightly over the next 3 days and then completely discontinue to see if this helps with mental status and recommend repeating LFTs in 2 weeks after discontinuation.  Reached out to PCP as well for further input and FYI.  Would recommend avoiding benzos in the future due to possible side effects especially in elderly and would likely more benefit from initiating SSRI or increasing Prozac dosage once stable.  Answered all questions to daughters satisfaction and daughter appreciative of phone call.

## 2019-11-05 ENCOUNTER — Encounter: Payer: Self-pay | Admitting: Adult Health

## 2019-12-08 ENCOUNTER — Telehealth: Payer: Self-pay

## 2019-12-08 NOTE — Telephone Encounter (Signed)
Pt called office and left VM advising [place] that she will cancel appts with our office and follow up with her PCP from now on. Appt was cancelled.

## 2019-12-10 ENCOUNTER — Ambulatory Visit: Payer: Medicare Other | Admitting: Adult Health

## 2021-05-08 ENCOUNTER — Other Ambulatory Visit: Payer: Self-pay | Admitting: Orthopaedic Surgery

## 2021-05-08 DIAGNOSIS — M542 Cervicalgia: Secondary | ICD-10-CM

## 2021-05-28 ENCOUNTER — Ambulatory Visit
Admission: RE | Admit: 2021-05-28 | Discharge: 2021-05-28 | Disposition: A | Discharge status: Home / Self Care | Payer: Medicare Other | Source: Ambulatory Visit | Attending: Orthopaedic Surgery | Admitting: Orthopaedic Surgery

## 2021-05-28 DIAGNOSIS — M542 Cervicalgia: Secondary | ICD-10-CM

## 2021-06-03 ENCOUNTER — Other Ambulatory Visit: Payer: Medicare Other

## 2021-12-04 ENCOUNTER — Other Ambulatory Visit: Payer: Self-pay

## 2021-12-04 ENCOUNTER — Encounter (HOSPITAL_COMMUNITY): Payer: Self-pay

## 2021-12-04 ENCOUNTER — Emergency Department (HOSPITAL_COMMUNITY)
Admission: EM | Admit: 2021-12-04 | Discharge: 2021-12-05 | Disposition: A | Discharge status: Skilled Nursing Facility | Payer: Medicare Other | Attending: Emergency Medicine | Admitting: Emergency Medicine

## 2021-12-04 DIAGNOSIS — Z7982 Long term (current) use of aspirin: Secondary | ICD-10-CM | POA: Diagnosis not present

## 2021-12-04 DIAGNOSIS — R42 Dizziness and giddiness: Secondary | ICD-10-CM | POA: Diagnosis not present

## 2021-12-04 DIAGNOSIS — R519 Headache, unspecified: Secondary | ICD-10-CM | POA: Insufficient documentation

## 2021-12-04 DIAGNOSIS — E871 Hypo-osmolality and hyponatremia: Secondary | ICD-10-CM | POA: Diagnosis not present

## 2021-12-04 DIAGNOSIS — R110 Nausea: Secondary | ICD-10-CM | POA: Insufficient documentation

## 2021-12-04 DIAGNOSIS — Z20822 Contact with and (suspected) exposure to covid-19: Secondary | ICD-10-CM | POA: Insufficient documentation

## 2021-12-04 DIAGNOSIS — E039 Hypothyroidism, unspecified: Secondary | ICD-10-CM | POA: Diagnosis not present

## 2021-12-04 DIAGNOSIS — Z79899 Other long term (current) drug therapy: Secondary | ICD-10-CM | POA: Insufficient documentation

## 2021-12-05 ENCOUNTER — Emergency Department (HOSPITAL_COMMUNITY): Payer: Medicare Other

## 2021-12-05 DIAGNOSIS — R42 Dizziness and giddiness: Secondary | ICD-10-CM | POA: Diagnosis not present

## 2021-12-05 LAB — COMPREHENSIVE METABOLIC PANEL
ALT: 18 U/L (ref 0–44)
AST: 26 U/L (ref 15–41)
Albumin: 3.7 g/dL (ref 3.5–5.0)
Alkaline Phosphatase: 55 U/L (ref 38–126)
Anion gap: 8 (ref 5–15)
BUN: 19 mg/dL (ref 8–23)
CO2: 22 mmol/L (ref 22–32)
Calcium: 8.7 mg/dL — ABNORMAL LOW (ref 8.9–10.3)
Chloride: 97 mmol/L — ABNORMAL LOW (ref 98–111)
Creatinine, Ser: 0.8 mg/dL (ref 0.44–1.00)
GFR, Estimated: >60 mL/min (ref >60)
Glucose, Bld: 100 mg/dL — ABNORMAL HIGH (ref 70–99)
Potassium: 4.1 mmol/L (ref 3.5–5.1)
Sodium: 127 mmol/L — ABNORMAL LOW (ref 135–145)
Total Bilirubin: 0.4 mg/dL (ref 0.3–1.2)
Total Protein: 6.5 g/dL (ref 6.5–8.1)

## 2021-12-05 LAB — RESP PANEL BY RT-PCR (FLU A&B, COVID) ARPGX2
Influenza A by PCR: NEGATIVE
Influenza B by PCR: NEGATIVE
SARS Coronavirus 2 by RT PCR: NEGATIVE

## 2021-12-05 LAB — CBC WITH DIFFERENTIAL/PLATELET
Abs Immature Granulocytes: 0.02 10*3/uL (ref 0.00–0.07)
Basophils Absolute: 0.1 10*3/uL (ref 0.0–0.1)
Basophils Relative: 1 %
Eosinophils Absolute: 0.2 10*3/uL (ref 0.0–0.5)
Eosinophils Relative: 2 %
HCT: 42.9 % (ref 36.0–46.0)
Hemoglobin: 14.3 g/dL (ref 12.0–15.0)
Immature Granulocytes: 0 %
Lymphocytes Relative: 11 %
Lymphs Abs: 1 10*3/uL (ref 0.7–4.0)
MCH: 30.6 pg (ref 26.0–34.0)
MCHC: 33.3 g/dL (ref 30.0–36.0)
MCV: 91.9 fL (ref 80.0–100.0)
Monocytes Absolute: 0.7 10*3/uL (ref 0.1–1.0)
Monocytes Relative: 8 %
Neutro Abs: 6.9 10*3/uL (ref 1.7–7.7)
Neutrophils Relative %: 78 %
Platelets: 311 10*3/uL (ref 150–400)
RBC: 4.67 MIL/uL (ref 3.87–5.11)
RDW: 13.2 % (ref 11.5–15.5)
WBC: 8.8 10*3/uL (ref 4.0–10.5)
nRBC: 0 % (ref 0.0–0.2)

## 2021-12-05 LAB — PROTIME-INR
INR: 1 (ref 0.8–1.2)
Prothrombin Time: 12.6 seconds (ref 11.4–15.2)

## 2021-12-05 LAB — RAPID URINE DRUG SCREEN, HOSP PERFORMED
Amphetamines: NOT DETECTED
Barbiturates: NOT DETECTED
Benzodiazepines: NOT DETECTED
Cocaine: NOT DETECTED
Opiates: NOT DETECTED
Tetrahydrocannabinol: NOT DETECTED

## 2021-12-05 LAB — ETHANOL: Alcohol, Ethyl (B): <10 mg/dL (ref <10)

## 2021-12-05 LAB — URINALYSIS, ROUTINE W REFLEX MICROSCOPIC
Bacteria, UA: NONE SEEN
Bilirubin Urine: NEGATIVE
Glucose, UA: NEGATIVE mg/dL
Hgb urine dipstick: NEGATIVE
Ketones, ur: 5 mg/dL — AB
Nitrite: NEGATIVE
Protein, ur: NEGATIVE mg/dL
Specific Gravity, Urine: 1.012 (ref 1.005–1.030)
pH: 8 (ref 5.0–8.0)

## 2021-12-05 LAB — APTT: aPTT: 25 seconds (ref 24–36)

## 2021-12-05 MED ORDER — ONDANSETRON HCL 4 MG/2ML IJ SOLN
4.0000 mg | Freq: Once | INTRAMUSCULAR | Status: AC
Start: 2021-12-05 — End: 2021-12-05
  Administered 2021-12-05: 4 mg via INTRAVENOUS
  Filled 2021-12-05: qty 2

## 2021-12-05 MED ORDER — ACETAMINOPHEN 325 MG PO TABS
650.0000 mg | ORAL_TABLET | Freq: Once | ORAL | Status: AC
  Administered 2021-12-05: 650 mg via ORAL
  Filled 2021-12-05: qty 2

## 2021-12-05 MED ORDER — SODIUM CHLORIDE 0.9 % IV BOLUS (SEPSIS)
1000.0000 mL | Freq: Once | INTRAVENOUS | Status: AC
  Administered 2021-12-05: 1000 mL via INTRAVENOUS

## 2021-12-05 NOTE — ED Provider Notes (Signed)
[facility] EMERGENCY DEPARTMENT Provider Note   CSN: [id number] Arrival date & time: 12/04/21  2336     History  Chief Complaint  Patient presents with   Dizziness    Sarah Fletcher is a 88 y.o. female.   Dizziness Associated symptoms: headaches   Associated symptoms: no chest pain, no hearing loss and no shortness of breath    Patient with previous history of TIA, depression presents with dizziness and headache.  Patient reports earlier today she started having dizziness as if things were spinning.  She also reports headache.  She reports now the dizziness is improving, no syncope. No new vision changes.  No focal weakness.  No chest pain or shortness of breath She did have some nausea but that is improved. She just saw her primary care provider at the independent living facility for fatigue she has had for several weeks   Past Medical History:  Diagnosis Date   Arthritis    Hypothyroid    Osteopenia     Home Medications Prior to Admission medications   Medication Sig Start Date End Date Taking? Authorizing Provider  acetaminophen (TYLENOL) 500 MG tablet Take 1,000 mg by mouth every 6 (six) hours as needed for moderate pain or headache.   Yes [provider]  aspirin EC 81 MG tablet Take 1 tablet (81 mg total) by mouth daily. 10/15/19  Yes McCue, Janett Billow, NP  denosumab (PROLIA) 60 MG/ML SOLN injection Inject 60 mg into the skin every 6 (six) months. Administer in upper arm, thigh, or abdomen   Yes [provider]  DOK 100 MG capsule Take 100 mg by mouth daily. 07/06/19  Yes [provider]  levothyroxine (SYNTHROID) 75 MCG tablet Take 75 mcg by mouth daily. 09/15/21  Yes [provider]  sertraline (ZOLOFT) 25 MG tablet Take 25 mg by mouth daily. 09/29/21  Yes [provider]  UNABLE TO FIND Take 1 tablet by mouth daily. Med Name: Viactive   Yes [provider]  atorvastatin (LIPITOR) 40 MG tablet Take 1  tablet (40 mg total) by mouth daily at 6 PM. Patient not taking: Reported on 12/05/2021 06/08/19 08/07/19  Antonieta Pert, MD      Allergies    Anesthetics, ester; Codeine; Hydrocodone-acetaminophen; and Percocet [oxycodone-acetaminophen]    Review of Systems   Review of Systems  Constitutional:  Negative for fever.  HENT:  Negative for hearing loss.   Eyes:  Negative for visual disturbance.  Respiratory:  Negative for shortness of breath.   Cardiovascular:  Negative for chest pain.  Neurological:  Positive for dizziness and headaches.    Physical Exam Updated Vital Signs BP (!) 152/77   Pulse 68   Temp 97.7 F (36.5 C) (Oral)   Resp 19   SpO2 95%  Physical Exam CONSTITUTIONAL: Elderly, no acute distress HEAD: Normocephalic/atraumatic EYES: EOMI/PERRL, no nystagmus,  no ptosis ENMT: Mucous membranes moist NECK: supple no meningeal signs CV: S1/S2 noted, no murmurs/rubs/gallops noted LUNGS: Lungs are clear to auscultation bilaterally, no apparent distress ABDOMEN: soft, nontender NEURO:Awake/alert, face symmetric, no arm or leg drift is noted Equal 5/5 strength with shoulder abduction, elbow flex/extension, wrist flex/extension in upper extremities and equal hand grips bilaterally Equal 5/5 strength with hip flexion,knee flex/extension, foot dorsi/plantar flexion Cranial nerves 3/4/5/6/12/31/08/11/12 tested and intact No past pointing Sensation to light touch intact in all extremities EXTREMITIES: pulses normal, full ROM SKIN: warm, color normal PSYCH: Anxious ED Results / Procedures / Treatments   Labs (  all labs ordered are listed, but only abnormal results are displayed) Labs Reviewed  COMPREHENSIVE METABOLIC PANEL - Abnormal; Notable for the following components:      Result Value   Sodium 127 (*)    Chloride 97 (*)    Glucose, Bld 100 (*)    Calcium 8.7 (*)    All other components within normal limits  URINALYSIS, ROUTINE W REFLEX MICROSCOPIC - Abnormal; Notable for  the following components:   Ketones, ur 5 (*)    Leukocytes,Ua TRACE (*)    All other components within normal limits  RESP PANEL BY RT-PCR (FLU A&B, COVID) ARPGX2  ETHANOL  PROTIME-INR  APTT  RAPID URINE DRUG SCREEN, HOSP PERFORMED  CBC WITH DIFFERENTIAL/PLATELET    EKG EKG Interpretation  Date/Time:  Monday December 04 2021 23:51:48 EDT Ventricular Rate:  73 PR Interval:  184 QRS Duration: 72 QT Interval:  404 QTC Calculation: 445 R Axis:   71 Text Interpretation: ** Poor data quality, interpretation may be adversely affected Normal sinus rhythm Cannot rule out Anterior infarct , age undetermined Abnormal ECG Interpretation limited secondary to artifact Confirmed by Ripley Fraise ([id number]) on 12/05/2021 3:08:06 AM  Radiology CT Head Wo Contrast  Result Date: 12/05/2021 CLINICAL DATA:  Dizziness. EXAM: CT HEAD WITHOUT CONTRAST TECHNIQUE: Contiguous axial images were obtained from the base of the skull through the vertex without intravenous contrast. RADIATION DOSE REDUCTION: This exam was performed according to the departmental dose-optimization program which includes automated exposure control, adjustment of the mA and/or kV according to patient size and/or use of iterative reconstruction technique. COMPARISON:  Head CT dated 06/06/2019. FINDINGS: Brain: Mild age-related atrophy and chronic microvascular ischemic changes. There is no acute intracranial hemorrhage. No mass effect or midline shift. No extra-axial fluid collection. Vascular: No hyperdense vessel or unexpected calcification. Skull: Normal. Negative for fracture or focal lesion. Sinuses/Orbits: No acute finding. Other: None IMPRESSION: 1. No acute intracranial pathology. 2. Mild age-related atrophy and chronic microvascular ischemic changes. Electronically Signed   By: Anner Crete M.D.   On: 12/05/2021 01:14    Procedures Procedures    Medications Ordered in ED Medications  acetaminophen (TYLENOL) tablet 650 mg (650  mg Oral Given 12/05/21 0408)  sodium chloride 0.9 % bolus 1,000 mL (0 mLs Intravenous Stopped 12/05/21 0634)  ondansetron (ZOFRAN) injection 4 mg (4 mg Intravenous Given 12/05/21 0981)    ED Course/ Medical Decision Making/ A&P Clinical Course as of 12/05/21 0706  Tue Dec 05, 2021  0307 Sodium(!): 127 Hyponatremia [DW]  0706 Patient improved, she is ambulatory.  Overall appears well.  She is safe for discharge home.  She will need close PCP follow-up at [facility] to have repeat labs and potential med adjustment.  No signs of acute stroke at this time [DW]    Clinical Course User Index [DW] Ripley Fraise, MD                           Medical Decision Making Amount and/or Complexity of Data Reviewed Labs:  Decision-making details documented in ED Course.  Risk OTC drugs. Prescription drug management.   This patient presents to the ED for concern of dizziness, this involves an extensive number of treatment options, and is a complaint that carries with it a high risk of complications and morbidity.  The differential diagnosis includes but is not limited to vertigo, CVA, electrolyte disturbance, acute coronary syndrome  Comorbidities that complicate the patient evaluation: Patient's presentation  is complicated by their history of chronic fatigue   Additional history obtained: Records reviewed Care Everywhere/External Records  Lab Tests: I Ordered, and personally interpreted labs.  The pertinent results include: Hyponatremia  Imaging Studies ordered: I ordered imaging studies including CT scan head   I independently visualized and interpreted imaging which showed no acute finding I agree with the radiologist interpretation  Cardiac Monitoring: The patient was maintained on a cardiac monitor.  I personally viewed and interpreted the cardiac monitor which showed an underlying rhythm of:  sinus rhythm  Medicines ordered and prescription drug management: I ordered medication  including Tylenol for headache IV fluids for hyponatremia Reevaluation of the patient after these medicines showed that the patient    improved  Test Considered: Consider further imaging, patient now improved   Reevaluation: After the interventions noted above, I reevaluated the patient and found that they have :improved  Complexity of problems addressed: Patient's presentation is most consistent with  acute presentation with potential threat to life or bodily function  Disposition: After consideration of the diagnostic results and the patient's response to treatment,  I feel that the patent would benefit from discharge   .           Final Clinical Impression(s) / ED Diagnoses Final diagnoses:  Vertigo  Hyponatremia    Rx / DC Orders ED Discharge Orders     None         Ripley Fraise, MD 12/05/21 [phone]

## 2021-12-05 NOTE — ED Notes (Signed)
Pt ambulated to bathroom with assistance.

## 2021-12-05 NOTE — ED Provider Triage Note (Signed)
Emergency Medicine Provider Triage Evaluation Note  Sarah Fletcher , a 88 y.o. female  was evaluated in triage.  Pt complains of dizziness that began @ 1700 today. Patient reports she was walking back from visiting a friend @ her retirement community when she started to feel dizzy like the room spinning and like she might pass out. She had nausea w/ an episode of emesis with this. Drank some water and sat down with some improvement. Still feels a little dizzy. Has had a frontal headache x 1 week. Denies change in vision, numbness, weakness, abdominal pain, chest pain or dyspnea. Denies recent tick bites or rashes. .  Review of Systems  Per above  Physical Exam  BP (!) 150/99 (BP Location: Right Arm)   Pulse 73   Temp 97.7 F (36.5 C) (Oral)   Resp 17   SpO2 100%  Gen:   Awake, no distress   Resp:  Normal effort  MSK:   Moves extremities without difficulty  Other:  PERRL. EOMI. No facial droop. CN III-XII grossly intact. Sensation grossly intact to bilateral upper/lower extremities. 5/5 symmetric grip strength & strength with plantar/dorsiflexion bilaterally. Intact finger to nose. Negative pronator drift. Ambulatory slowly, not  significantly ataxic.   Medical Decision Making  Medically screening exam initiated at 12:01 AM.  Appropriate orders placed.  Sarah Fletcher was informed that the remainder of the evaluation will be completed by another provider, this initial triage assessment does not replace that evaluation, and the importance of remaining in the ED until their evaluation is complete.  Dizziness.  Discussed w/ attending who evaluated patient in triage- no code stroke, I have placed orders.    Amaryllis Dyke, PA-C 12/05/21 0003

## 2021-12-05 NOTE — Discharge Instructions (Addendum)
Your sodium was 127, please have this rechecked later this week  Your exam shows you have had an episode of vertigo, which causes a false sense of movement such as a spinning feeling or walls that seem to move.  Most vertigo is caused by a (usually temporary) problem in the inner ear. Rarely, the back part of the brain can cause vertigo (some mini-strokes/strokes), but it appears to be a low risk cause for you at this time. It is important to follow-up with your doctor however, to see if you need further testing.   Do not drive or participate in potentially dangerous activities requiring balance unless off meds (not drowsy) and the vertigo has resolved. Most of the time benign vertigo is much better after a few days. However, mild unsteadiness may last for up to 3 months in some patients. An MRI scan or other special tests to evaluate your hearing and balance may be needed if the vertigo does not improve or returns in the future.   RETURN IMMEDIATELY IF YOU HAVE ANY OF THE FOLLOWING (call 911): Increasing vertigo, earache, ear drainage, or loss of hearing.  Severe headache, blurred or double vision, or trouble walking.  Fainting or poorly responsive, extreme weakness, chest pain, or palpitations.  Fever, persistent vomiting, or dehydration.  Numbness, tingling, incoordination, or weakness of the limbs.  Change in speech, vision, swallowing, understanding, or other concerns.

## 2021-12-05 NOTE — ED Notes (Signed)
Pt feeling nauseous. Wickline MD made aware

## 2021-12-05 NOTE — ED Notes (Signed)
[facility] transport called to transport pt back to assisted living

## 2021-12-30 ENCOUNTER — Other Ambulatory Visit: Payer: Self-pay

## 2021-12-30 ENCOUNTER — Emergency Department (HOSPITAL_BASED_OUTPATIENT_CLINIC_OR_DEPARTMENT_OTHER): Payer: Medicare Other

## 2021-12-30 ENCOUNTER — Emergency Department (HOSPITAL_BASED_OUTPATIENT_CLINIC_OR_DEPARTMENT_OTHER)
Admission: EM | Admit: 2021-12-30 | Discharge: 2021-12-30 | Disposition: A | Discharge status: Home / Self Care | Payer: Medicare Other | Attending: Emergency Medicine | Admitting: Emergency Medicine

## 2021-12-30 ENCOUNTER — Encounter (HOSPITAL_BASED_OUTPATIENT_CLINIC_OR_DEPARTMENT_OTHER): Payer: Self-pay | Admitting: Emergency Medicine

## 2021-12-30 DIAGNOSIS — Z79899 Other long term (current) drug therapy: Secondary | ICD-10-CM | POA: Diagnosis not present

## 2021-12-30 DIAGNOSIS — R42 Dizziness and giddiness: Secondary | ICD-10-CM | POA: Insufficient documentation

## 2021-12-30 DIAGNOSIS — K5730 Diverticulosis of large intestine without perforation or abscess without bleeding: Secondary | ICD-10-CM | POA: Insufficient documentation

## 2021-12-30 DIAGNOSIS — E039 Hypothyroidism, unspecified: Secondary | ICD-10-CM | POA: Insufficient documentation

## 2021-12-30 DIAGNOSIS — R531 Weakness: Secondary | ICD-10-CM | POA: Insufficient documentation

## 2021-12-30 DIAGNOSIS — G8929 Other chronic pain: Secondary | ICD-10-CM | POA: Diagnosis not present

## 2021-12-30 DIAGNOSIS — M545 Low back pain, unspecified: Secondary | ICD-10-CM | POA: Insufficient documentation

## 2021-12-30 DIAGNOSIS — Z7982 Long term (current) use of aspirin: Secondary | ICD-10-CM | POA: Diagnosis not present

## 2021-12-30 DIAGNOSIS — R079 Chest pain, unspecified: Secondary | ICD-10-CM | POA: Insufficient documentation

## 2021-12-30 DIAGNOSIS — R5383 Other fatigue: Secondary | ICD-10-CM | POA: Diagnosis not present

## 2021-12-30 DIAGNOSIS — M549 Dorsalgia, unspecified: Secondary | ICD-10-CM | POA: Diagnosis present

## 2021-12-30 LAB — BASIC METABOLIC PANEL
Anion gap: 8 (ref 5–15)
BUN: 18 mg/dL (ref 8–23)
CO2: 23 mmol/L (ref 22–32)
Calcium: 9.1 mg/dL (ref 8.9–10.3)
Chloride: 103 mmol/L (ref 98–111)
Creatinine, Ser: 0.85 mg/dL (ref 0.44–1.00)
GFR, Estimated: >60 mL/min (ref >60)
Glucose, Bld: 90 mg/dL (ref 70–99)
Potassium: 3.8 mmol/L (ref 3.5–5.1)
Sodium: 134 mmol/L — ABNORMAL LOW (ref 135–145)

## 2021-12-30 LAB — TROPONIN I (HIGH SENSITIVITY)
Troponin I (High Sensitivity): 4 ng/L (ref <18)
Troponin I (High Sensitivity): 4 ng/L (ref <18)

## 2021-12-30 LAB — CBC
HCT: 43.1 % (ref 36.0–46.0)
Hemoglobin: 14.5 g/dL (ref 12.0–15.0)
MCH: 30.4 pg (ref 26.0–34.0)
MCHC: 33.6 g/dL (ref 30.0–36.0)
MCV: 90.4 fL (ref 80.0–100.0)
Platelets: 396 10*3/uL (ref 150–400)
RBC: 4.77 MIL/uL (ref 3.87–5.11)
RDW: 13.5 % (ref 11.5–15.5)
WBC: 7.9 10*3/uL (ref 4.0–10.5)
nRBC: 0 % (ref 0.0–0.2)

## 2021-12-30 LAB — CK: Total CK: 69 U/L (ref 38–234)

## 2021-12-30 LAB — URINALYSIS, ROUTINE W REFLEX MICROSCOPIC
Bilirubin Urine: NEGATIVE
Glucose, UA: NEGATIVE mg/dL
Hgb urine dipstick: NEGATIVE
Ketones, ur: NEGATIVE mg/dL
Nitrite: NEGATIVE
Protein, ur: NEGATIVE mg/dL
Specific Gravity, Urine: 1.01 (ref 1.005–1.030)
pH: 7 (ref 5.0–8.0)

## 2021-12-30 LAB — URINALYSIS, MICROSCOPIC (REFLEX)

## 2021-12-30 MED ORDER — IOHEXOL 300 MG/ML  SOLN
75.0000 mL | Freq: Once | INTRAMUSCULAR | Status: AC | PRN
  Administered 2021-12-30: 75 mL via INTRAVENOUS

## 2021-12-30 MED ORDER — SODIUM CHLORIDE 0.9 % IV BOLUS
1000.0000 mL | Freq: Once | INTRAVENOUS | Status: AC
  Administered 2021-12-30: 1000 mL via INTRAVENOUS

## 2021-12-30 NOTE — ED Notes (Signed)
Pt VAN neg

## 2021-12-30 NOTE — ED Notes (Signed)
ED Provider at bedside. 

## 2021-12-30 NOTE — ED Provider Notes (Signed)
  Physical Exam  BP (!) 159/85   Pulse 73   Temp 97.8 F (36.6 C) (Oral)   Resp 16   Ht 5' (1.524 m)   Wt 58.1 kg   SpO2 100%   BMI 25.00 kg/m   Physical Exam  Procedures  Procedures  ED Course / MDM    Medical Decision Making Patient care assumed at 3 PM.  Patient has some back pain and dizziness and chest pain.  Signed out pending second troponin.   4:21 PM Second troponin negative.  Stable for discharge home.  Problems Addressed: Chronic midline low back pain without sciatica: acute illness or injury Weakness: acute illness or injury  Amount and/or Complexity of Data Reviewed Labs: ordered. Decision-making details documented in ED Course. Radiology: ordered and independent interpretation performed. Decision-making details documented in ED Course. ECG/medicine tests: ordered and independent interpretation performed. Decision-making details documented in ED Course.  Risk Prescription drug management.          Drenda Freeze, MD 12/30/21 [phone]

## 2021-12-30 NOTE — ED Notes (Signed)
Ambulated with cane to BR, gait steady

## 2021-12-30 NOTE — ED Notes (Signed)
Patient transported to X-ray 

## 2021-12-30 NOTE — Discharge Instructions (Signed)
Your testing is reassuring.  Electrolytes are normal.  Urinalysis is normal.  There is no evidence of any surgical problems on your CT scan. Follow-up with your primary doctor and take your medications as prescribed.  Return to the ED for worsening pain, fever, weakness, numbness, tingling, bowel or bladder incontinence or any other concerns.

## 2021-12-30 NOTE — ED Notes (Signed)
Report provided to Cristina Gong Paramedic to continue care.

## 2021-12-30 NOTE — ED Triage Notes (Addendum)
Pt arrives pov from [facility] , to triage in wheelchair, c/o bilateral lower back pain. Meloxicam not helping pain. Pt denies dizziness. Pt also endorses concern for dehydration. AOx4 Pt then reports "feeling weak", instructed that we would do EKG

## 2021-12-30 NOTE — ED Provider Notes (Signed)
[facility] EMERGENCY DEPARTMENT Provider Note   CSN: [id number] Arrival date & time: 12/30/21  1200     History  Chief Complaint  Patient presents with   Back Pain   Fatigue    Sarah Fletcher is a 88 y.o. female.  Patient with a history of arthritis, hypothyroidism and osteopenia here with concern for dehydration.  States has been fatigued for the past several months and has been following up with the caregiver at her nursing facility.  She has had ongoing back pain for the past several weeks which she attributes to her arthritis.  Denies any fall or injury.  Has been taking meloxicam with partial relief.  Denies any significant worsening of the pain today.  There is no focal weakness, numbness or tingling.  There is no bowel or bladder incontinence.  There is no fever.  No history of cancer or IV drug abuse.  Patient states she is concerned because she is getting older and was concerned that she could have "psychological causes" of feeling off. She was trying to be seen by her caregiver at her nursing facility today but they were not available. She states she is not having much back pain.  But she does feel she is dehydrated.  Denies any vomiting or diarrhea and states has been eating and drinking well.  No pain with urination or blood in the urine.  No chest pain or shortness of breath.  No cough or fever. Denies any dizziness or lightheadedness.  No focal weakness, numbness or tingling.  No bowel or bladder incontinence  The history is provided by the patient and a relative.  Back Pain Associated symptoms: weakness   Associated symptoms: no abdominal pain, no chest pain, no dysuria, no fever and no headaches        Home Medications Prior to Admission medications   Medication Sig Start Date End Date Taking? Authorizing Provider  acetaminophen (TYLENOL) 500 MG tablet Take 1,000 mg by mouth every 6 (six) hours as needed for moderate pain or headache.    [provider]  aspirin EC 81 MG tablet Take 1 tablet (81 mg total) by mouth daily. 10/15/19   Frann Rider, NP  atorvastatin (LIPITOR) 40 MG tablet Take 1 tablet (40 mg total) by mouth daily at 6 PM. Patient not taking: Reported on 12/05/2021 06/08/19 08/07/19  Antonieta Pert, MD  denosumab (PROLIA) 60 MG/ML SOLN injection Inject 60 mg into the skin every 6 (six) months. Administer in upper arm, thigh, or abdomen    [provider]  DOK 100 MG capsule Take 100 mg by mouth daily. 07/06/19   [provider]  levothyroxine (SYNTHROID) 75 MCG tablet Take 75 mcg by mouth daily. 09/15/21   [provider]  sertraline (ZOLOFT) 25 MG tablet Take 25 mg by mouth daily. 09/29/21   [provider]  UNABLE TO FIND Take 1 tablet by mouth daily. Med Name: [facility]    [provider]      Allergies    Anesthetics, ester; Codeine; Hydrocodone-acetaminophen; and Percocet [oxycodone-acetaminophen]    Review of Systems   Review of Systems  Constitutional:  Positive for activity change, appetite change and fatigue. Negative for fever.  HENT:  Negative for congestion.   Eyes:  Negative for visual disturbance.  Respiratory:  Negative for cough, chest tightness and shortness of breath.   Cardiovascular:  Negative for chest pain.  Gastrointestinal:  Negative for abdominal pain, nausea and vomiting.  Genitourinary:  Negative  for dysuria and hematuria.  Musculoskeletal:  Positive for arthralgias, back pain and myalgias.  Skin:  Negative for rash.  Neurological:  Positive for weakness. Negative for dizziness, light-headedness and headaches.   all other systems are negative except as noted in the HPI and PMH.    Physical Exam Updated Vital Signs BP 137/78   Pulse 76   Temp 97.8 F (36.6 C) (Oral)   Resp 17   Ht 5' (1.524 m)   Wt 58.1 kg   SpO2 100%   BMI 25.00 kg/m  Physical Exam Vitals and nursing note reviewed.  Constitutional:      General: She is not in acute distress.     Appearance: She is well-developed. She is not ill-appearing.  HENT:     Head: Normocephalic and atraumatic.     Mouth/Throat:     Pharynx: No oropharyngeal exudate.  Eyes:     Conjunctiva/sclera: Conjunctivae normal.     Pupils: Pupils are equal, round, and reactive to light.  Neck:     Comments: No meningismus. Cardiovascular:     Rate and Rhythm: Normal rate and regular rhythm.     Heart sounds: Normal heart sounds. No murmur heard. Pulmonary:     Effort: Pulmonary effort is normal. No respiratory distress.     Breath sounds: Normal breath sounds.  Chest:     Chest wall: No tenderness.  Abdominal:     Palpations: Abdomen is soft.     Tenderness: There is no abdominal tenderness. There is no guarding or rebound.  Musculoskeletal:        General: Tenderness present. Normal range of motion.     Cervical back: Normal range of motion and neck supple.     Comments: Midline lumbar and sacral tenderness, no step-off  5/5 strength in bilateral lower extremities. Ankle plantar and dorsiflexion intact. Great toe extension intact bilaterally. +2 DP and PT pulses. Normal gait.   Skin:    General: Skin is warm.  Neurological:     Mental Status: She is alert and oriented to person, place, and time.     Cranial Nerves: No cranial nerve deficit.     Motor: No abnormal muscle tone.     Coordination: Coordination normal.     Comments:  5/5 strength throughout. CN 2-12 intact.Equal grip strength.   Psychiatric:        Behavior: Behavior normal.     ED Results / Procedures / Treatments   Labs (all labs ordered are listed, but only abnormal results are displayed) Labs Reviewed  BASIC METABOLIC PANEL - Abnormal; Notable for the following components:      Result Value   Sodium 134 (*)    All other components within normal limits  URINALYSIS, ROUTINE W REFLEX MICROSCOPIC - Abnormal; Notable for the following components:   Leukocytes,Ua TRACE (*)    All other components within normal  limits  URINALYSIS, MICROSCOPIC (REFLEX) - Abnormal; Notable for the following components:   Bacteria, UA RARE (*)    All other components within normal limits  CBC  CK  TROPONIN I (HIGH SENSITIVITY)  TROPONIN I (HIGH SENSITIVITY)    EKG EKG Interpretation  Date/Time:  Saturday December 30 2021 12:18:12 EDT Ventricular Rate:  75 PR Interval:  180 QRS Duration: 74 QT Interval:  378 QTC Calculation: 422 R Axis:   76 Text Interpretation: Normal sinus rhythm Low voltage QRS Cannot rule out Anterior infarct , age undetermined Abnormal ECG When compared with ECG of 04-Dec-2021 23:51, PREVIOUS  ECG IS PRESENT Interpretation limited secondary to artifact No significant change was found Confirmed by Ezequiel Essex [phone]) on 12/30/2021 12:22:28 PM  Radiology CT L-SPINE NO CHARGE  Result Date: 12/30/2021 CLINICAL DATA:  Bilateral lower back pain.  No injury. EXAM: CT LUMBAR SPINE WITHOUT CONTRAST TECHNIQUE: Multidetector CT imaging of the lumbar spine was performed without intravenous contrast administration. Multiplanar CT image reconstructions were also generated. RADIATION DOSE REDUCTION: This exam was performed according to the departmental dose-optimization program which includes automated exposure control, adjustment of the mA and/or kV according to patient size and/or use of iterative reconstruction technique. COMPARISON:  CT abdomen pelvis dated July 20, 2014. FINDINGS: Segmentation: 5 lumbar type vertebrae. Alignment: Levoscoliosis. 3 mm retrolisthesis at L1-L2. Trace anterolisthesis at L2-L3 and L3-L4. 3 mm anterolisthesis at L4-L5. Vertebrae: No acute fracture or focal pathologic process. Chronic L2 superior endplate compression deformity with mildly progressive height loss since 2016. Paraspinal and other soft tissues: Aortoiliac atherosclerotic vascular disease. Disc levels: Multilevel disc bulging and advanced facet arthropathy. Moderate L3-L4 and severe L4-L5 spinal canal stenosis.  Moderate neuroforaminal stenosis on the right at L1-L2. Moderate to severe neuroforaminal stenosis on the right at L2-L3 and on the left at L4-L5. IMPRESSION: 1. No acute lumbar spine fracture. 2. Chronic L2 superior endplate compression deformity with mildly progressive height loss since 2016. 3. Advanced multilevel degenerative changes throughout the lumbar spine as described above. Moderate L3-L4 and severe L4-L5 spinal canal stenosis. 4. Aortic Atherosclerosis (ICD10-I70.0). Electronically Signed   By: Titus Dubin M.D.   On: 12/30/2021 16:35   CT ABDOMEN PELVIS W CONTRAST  Result Date: 12/30/2021 CLINICAL DATA:  Low back pain, kidney stones suspected EXAM: CT ABDOMEN AND PELVIS WITH CONTRAST TECHNIQUE: Multidetector CT imaging of the abdomen and pelvis was performed using the standard protocol following bolus administration of intravenous contrast. RADIATION DOSE REDUCTION: This exam was performed according to the departmental dose-optimization program which includes automated exposure control, adjustment of the mA and/or kV according to patient size and/or use of iterative reconstruction technique. CONTRAST:  65m OMNIPAQUE IOHEXOL 300 MG/ML  SOLN COMPARISON:  07/20/2014 FINDINGS: Lower chest: No acute abnormality. Hepatobiliary: No solid liver abnormality is seen. No gallstones, gallbladder wall thickening, or biliary dilatation. Pancreas: Unremarkable. No pancreatic ductal dilatation or surrounding inflammatory changes. Spleen: Normal in size without significant abnormality. Adrenals/Urinary Tract: Adrenal glands are unremarkable. Kidneys are normal, without renal calculi, solid lesion, or hydronephrosis. Bladder is unremarkable. Stomach/Bowel: Stomach is within normal limits. Appendix not clearly visualized and may be surgically absent. No evidence of bowel wall thickening, distention, or inflammatory changes. Descending and sigmoid diverticulosis. Vascular/Lymphatic: Aortic atherosclerosis. No  enlarged abdominal or pelvic lymph nodes. Reproductive: Status post hysterectomy. Other: No abdominal wall hernia or abnormality. No ascites. Musculoskeletal: No acute or significant osseous findings. IMPRESSION: 1. No acute CT findings of the abdomen or pelvis to explain low back pain. 2. No evidence of urinary tract calculus or hydronephrosis. 3. Descending and sigmoid diverticulosis without evidence of acute diverticulitis. Aortic Atherosclerosis (ICD10-I70.0). Electronically Signed   By: ADelanna AhmadiM.D.   On: 12/30/2021 14:55   DG Chest 2 View  Result Date: 12/30/2021 CLINICAL DATA:  Weakness. EXAM: CHEST - 2 VIEW COMPARISON:  12/06/2018 FINDINGS: Lungs are hyperexpanded. Subsegmental atelectasis seen left lung base. No edema or focal airspace consolidation. No pleural effusion. Cardiopericardial silhouette is at upper limits of normal for size. Telemetry leads overlie the chest. IMPRESSION: Hyperexpansion without acute cardiopulmonary findings. Electronically Signed   By: EMisty Stanley  M.D.   On: 12/30/2021 13:57    Procedures Procedures    Medications Ordered in ED Medications  sodium chloride 0.9 % bolus 1,000 mL (has no administration in time range)    ED Course/ Medical Decision Making/ A&P                           Medical Decision Making Amount and/or Complexity of Data Reviewed Labs: ordered. Radiology: ordered and independent interpretation performed. Decision-making details documented in ED Course. ECG/medicine tests: ordered and independent interpretation performed. Decision-making details documented in ED Course.  Risk Prescription drug management.  Patient with ongoing lower back pain for several weeks.  Recently treated at her facility for same as piriformis inflammation.  She is taken meloxicam with good relief.  No fall or trauma. Low suspicion for cord compression or cauda equina.  Also concern for dehydration.  She appears quite well with stable vital signs.   Will give fluids and check labs and urinalysis.  Statics are negative.  Patient given IV fluids.  Electrolytes are reassuring and creatinine is normal.  EKG shows sinus rhythm without acute ST changes. Urinalysis is negative.  CT without acute findings or explanation for back pain. Results reviewed and interpreted by me. Patient does have chronic L2 compression fracture and multilevel degenerative changes which may be contributing.   Pain has improved. She is able to tolerate PO and ambulate. Labs without evidence of dehydration.  PLan discharge with supportive care and outpatient followup pending second troponin.  Dr. Darl Householder to assume care.          Final Clinical Impression(s) / ED Diagnoses Final diagnoses:  Weakness  Chronic midline low back pain without sciatica    Rx / DC Orders ED Discharge Orders     None         Shatora Weatherbee, Annie Main, MD 12/30/21 1657

## 2022-07-20 ENCOUNTER — Emergency Department (HOSPITAL_BASED_OUTPATIENT_CLINIC_OR_DEPARTMENT_OTHER): Payer: Medicare Other | Admitting: Radiology

## 2022-07-20 ENCOUNTER — Encounter (HOSPITAL_BASED_OUTPATIENT_CLINIC_OR_DEPARTMENT_OTHER): Payer: Self-pay | Admitting: Emergency Medicine

## 2022-07-20 ENCOUNTER — Other Ambulatory Visit: Payer: Self-pay

## 2022-07-20 DIAGNOSIS — Z7982 Long term (current) use of aspirin: Secondary | ICD-10-CM | POA: Diagnosis not present

## 2022-07-20 DIAGNOSIS — Z96652 Presence of left artificial knee joint: Secondary | ICD-10-CM | POA: Insufficient documentation

## 2022-07-20 DIAGNOSIS — W010XXA Fall on same level from slipping, tripping and stumbling without subsequent striking against object, initial encounter: Secondary | ICD-10-CM | POA: Insufficient documentation

## 2022-07-20 DIAGNOSIS — S81012A Laceration without foreign body, left knee, initial encounter: Secondary | ICD-10-CM | POA: Diagnosis not present

## 2022-07-20 DIAGNOSIS — E039 Hypothyroidism, unspecified: Secondary | ICD-10-CM | POA: Diagnosis not present

## 2022-07-20 DIAGNOSIS — Z79899 Other long term (current) drug therapy: Secondary | ICD-10-CM | POA: Insufficient documentation

## 2022-07-20 DIAGNOSIS — S8992XA Unspecified injury of left lower leg, initial encounter: Secondary | ICD-10-CM | POA: Diagnosis present

## 2022-07-20 NOTE — ED Triage Notes (Signed)
Fall, tripped on carpet. Large wound on left lower tight above knee,deep. Bleeding.  Denies blood thinner

## 2022-07-21 ENCOUNTER — Emergency Department (HOSPITAL_BASED_OUTPATIENT_CLINIC_OR_DEPARTMENT_OTHER)
Admission: EM | Admit: 2022-07-21 | Discharge: 2022-07-21 | Disposition: A | Discharge status: Home / Self Care | Payer: Medicare Other | Attending: Emergency Medicine | Admitting: Emergency Medicine

## 2022-07-21 DIAGNOSIS — S81012A Laceration without foreign body, left knee, initial encounter: Secondary | ICD-10-CM | POA: Diagnosis not present

## 2022-07-21 DIAGNOSIS — S81812A Laceration without foreign body, left lower leg, initial encounter: Secondary | ICD-10-CM

## 2022-07-21 MED ORDER — LIDOCAINE HCL 1 % IJ SOLN
INTRAMUSCULAR | Status: AC
  Filled 2022-07-21: qty 20

## 2022-07-21 MED ORDER — LIDOCAINE HCL (PF) 1 % IJ SOLN: 10.0000 mL | Freq: Once | INTRAMUSCULAR | Status: DC

## 2022-07-21 MED ORDER — CEPHALEXIN 500 MG PO CAPS: 500.0000 mg | ORAL_CAPSULE | Freq: Four times a day (QID) | ORAL | 0 refills | Status: AC

## 2022-07-21 NOTE — Discharge Instructions (Addendum)
Local wound care with bacitracin and dressing changes twice daily.  Begin taking Keflex as prescribed.  Take Tylenol or ibuprofen as needed for pain.  Follow-up with orthopedics later this week for a recheck, and return to the ER if you experience any new or concerning issues.  Wear the knee immobilizer and do not bend your knee until seen by Dr. Rhona Raider.

## 2022-07-21 NOTE — ED Provider Notes (Signed)
[facility] Provider Note   CSN: [id number] Arrival date & time: 07/20/22  2158     History  Chief Complaint  Patient presents with   Sarah Fletcher is a 89 y.o. female.  Patient is a 89 year old female with history of hypothyroidism, prior TIA, depression prior left total knee replacement.  Patient presenting today for evaluation of a left knee injury.  Patient was walking when she tripped and fell on the ground.  She has a large laceration above the left knee.  Bleeding controlled with direct pressure.  The history is provided by the patient.       Home Medications Prior to Admission medications   Medication Sig Start Date End Date Taking? Authorizing Provider  acetaminophen (TYLENOL) 500 MG tablet Take 1,000 mg by mouth every 6 (six) hours as needed for moderate pain or headache.    [provider]  aspirin EC 81 MG tablet Take 1 tablet (81 mg total) by mouth daily. 10/15/19   Frann Rider, NP  atorvastatin (LIPITOR) 40 MG tablet Take 1 tablet (40 mg total) by mouth daily at 6 PM. Patient not taking: Reported on 12/05/2021 06/08/19 08/07/19  Antonieta Pert, MD  denosumab (PROLIA) 60 MG/ML SOLN injection Inject 60 mg into the skin every 6 (six) months. Administer in upper arm, thigh, or abdomen    [provider]  DOK 100 MG capsule Take 100 mg by mouth daily. 07/06/19   [provider]  levothyroxine (SYNTHROID) 75 MCG tablet Take 75 mcg by mouth daily. 09/15/21   [provider]  sertraline (ZOLOFT) 25 MG tablet Take 25 mg by mouth daily. 09/29/21   [provider]  UNABLE TO FIND Take 1 tablet by mouth daily. Med Name: [facility]    [provider]      Allergies    Anesthetics, ester; Codeine; Hydrocodone-acetaminophen; and Percocet [oxycodone-acetaminophen]    Review of Systems   Review of Systems  All other systems reviewed and are negative.   Physical Exam Updated Vital  Signs BP (!) 142/77 (BP Location: Right Arm)   Pulse 67   Temp 98.6 F (37 C) (Oral)   Resp 16   SpO2 98%  Physical Exam Vitals and nursing note reviewed.  Constitutional:      Appearance: Normal appearance.  HENT:     Head: Normocephalic and atraumatic.  Pulmonary:     Effort: Pulmonary effort is normal.  Musculoskeletal:     Comments: There is a large laceration measuring approximately 10 cm extending perpendicular to the leg just above the knee joint.  This is contained to the soft tissues.  Distal pulse, motor, and sensory intact.  Skin:    General: Skin is warm and dry.  Neurological:     Mental Status: She is alert and oriented to person, place, and time.     ED Results / Procedures / Treatments   Labs (all labs ordered are listed, but only abnormal results are displayed) Labs Reviewed - No data to display  EKG None  Radiology DG Knee Complete 4 Views Left  Result Date: 07/20/2022 CLINICAL DATA:  Tripped on carpet.  Large cut to top of knee EXAM: LEFT KNEE - COMPLETE 4+ VIEW COMPARISON:  None Available. FINDINGS: Left TKA. No radiographic evidence of loosening. No acute fracture or dislocation. Small knee joint effusion. Laceration about the anterior knee. IMPRESSION: Laceration to the anterior knee. No acute osseous abnormality. Left TKA. Electronically Signed  By: Placido Sou M.D.   On: 07/20/2022 23:46    Procedures Procedures    Medications Ordered in ED Medications  lidocaine (PF) (XYLOCAINE) 1 % injection 10 mL (has no administration in time range)    ED Course/ Medical Decision Making/ A&P  Patient is a 89 year old female presenting with complaints of a left knee injury sustained during a fall this evening.  She has a large laceration above the knee.  The laceration does not involve the underlying structures.  I see no exposed bone, tendon, or hardware from her prior knee surgery.  Laceration was repaired as below.  Patient to be discharged with  Keflex and follow-up with her orthopedist later this week.  She will be placed in a knee immobilizer and is not to bend her knee until okayed by orthopedics.  LACERATION REPAIR Performed by: Veryl Speak Authorized by: Veryl Speak Consent: Verbal consent obtained. Risks and benefits: risks, benefits and alternatives were discussed Consent given by: patient Patient identity confirmed: provided demographic data Prepped and Draped in normal sterile fashion Wound explored  Laceration Location: Left leg  Laceration Length: 10 cm  No Foreign Bodies seen or palpated  Anesthesia: local infiltration  Local anesthetic: lidocaine 1% without epinephrine  Anesthetic total: 10 ml  Irrigation method: syringe Amount of cleaning: standard  Skin closure: Staples  Number of sutures: 13  Technique: Staples  Patient tolerance: Patient tolerated the procedure well with no immediate complications.   Final Clinical Impression(s) / ED Diagnoses Final diagnoses:  None    Rx / DC Orders ED Discharge Orders     None         Veryl Speak, MD 07/21/22 [phone]
